# Patient Record
Sex: Female | Born: 1995
Health system: Southern US, Community
[De-identification: ages and names within clinical notes are randomized; demographics above are authoritative.]

## PROBLEM LIST (undated history)

## (undated) ENCOUNTER — Ambulatory Visit (HOSPITAL_COMMUNITY): Admission: EM | Payer: Self-pay | Source: Home / Self Care

## (undated) ENCOUNTER — Ambulatory Visit (HOSPITAL_COMMUNITY): Payer: Medicaid Other

## (undated) DIAGNOSIS — D649 Anemia, unspecified: Secondary | ICD-10-CM

## (undated) HISTORY — PX: INDUCED ABORTION: SHX677

---

## 2017-11-21 ENCOUNTER — Encounter (HOSPITAL_COMMUNITY): Payer: Self-pay | Admitting: Emergency Medicine

## 2017-11-21 ENCOUNTER — Emergency Department (HOSPITAL_COMMUNITY)
Admission: EM | Admit: 2017-11-21 | Discharge: 2017-11-21 | Discharge status: Against Medical Advice | Payer: Self-pay | Attending: Emergency Medicine | Admitting: Emergency Medicine

## 2017-11-21 DIAGNOSIS — Z5321 Procedure and treatment not carried out due to patient leaving prior to being seen by health care provider: Secondary | ICD-10-CM | POA: Insufficient documentation

## 2017-11-21 HISTORY — DX: Anemia, unspecified: D64.9

## 2017-11-21 LAB — CBC
HCT: 40.9 % (ref 36.0–46.0)
HEMOGLOBIN: 13.8 g/dL (ref 12.0–15.0)
MCH: 33.7 pg (ref 26.0–34.0)
MCHC: 33.7 g/dL (ref 30.0–36.0)
MCV: 99.8 fL (ref 78.0–100.0)
PLATELETS: 240 10*3/uL (ref 150–400)
RBC: 4.1 MIL/uL (ref 3.87–5.11)
RDW: 11.8 % (ref 11.5–15.5)
WBC: 9.5 10*3/uL (ref 4.0–10.5)

## 2017-11-21 LAB — COMPREHENSIVE METABOLIC PANEL
ALK PHOS: 61 U/L (ref 38–126)
ALT: 12 U/L — ABNORMAL LOW (ref 14–54)
ANION GAP: 6 (ref 5–15)
AST: 24 U/L (ref 15–41)
Albumin: 4.4 g/dL (ref 3.5–5.0)
BILIRUBIN TOTAL: 0.5 mg/dL (ref 0.3–1.2)
BUN: 11 mg/dL (ref 6–20)
CALCIUM: 9.2 mg/dL (ref 8.9–10.3)
CO2: 25 mmol/L (ref 22–32)
CREATININE: 0.63 mg/dL (ref 0.44–1.00)
Chloride: 109 mmol/L (ref 101–111)
Glucose, Bld: 103 mg/dL — ABNORMAL HIGH (ref 65–99)
Potassium: 3.7 mmol/L (ref 3.5–5.1)
SODIUM: 140 mmol/L (ref 135–145)
TOTAL PROTEIN: 7.1 g/dL (ref 6.5–8.1)

## 2017-11-21 LAB — I-STAT BETA HCG BLOOD, ED (MC, WL, AP ONLY)

## 2017-11-21 LAB — LIPASE, BLOOD: Lipase: 24 U/L (ref 11–51)

## 2017-11-21 MED ORDER — ONDANSETRON 4 MG PO TBDP
4.0000 mg | ORAL_TABLET | Freq: Once | ORAL | Status: AC | PRN
  Administered 2017-11-21: 4 mg via ORAL
  Filled 2017-11-21: qty 1

## 2017-11-21 NOTE — ED Notes (Signed)
Pt called for room placement no response.

## 2017-11-21 NOTE — ED Notes (Signed)
No answer from lobby  

## 2017-11-21 NOTE — ED Triage Notes (Signed)
Patient c/o abdominal pain that has been intermittent for past few years. Describes it as a wave across the bottom of her stomach. Pt states she has never been diagnosed with anything. One episode of emesis this am which is new symptom.

## 2017-11-21 NOTE — ED Notes (Signed)
Patient called with no response.

## 2017-11-21 NOTE — ED Notes (Signed)
Pt had drawn for labs:  Gold Lavender Blue Lt green Dark green x2 

## 2018-06-01 ENCOUNTER — Encounter (HOSPITAL_COMMUNITY): Payer: Self-pay | Admitting: Emergency Medicine

## 2018-06-01 ENCOUNTER — Emergency Department (HOSPITAL_COMMUNITY)
Admission: EM | Admit: 2018-06-01 | Discharge: 2018-06-02 | Discharge status: Against Medical Advice | Payer: Self-pay | Attending: Emergency Medicine | Admitting: Emergency Medicine

## 2018-06-01 ENCOUNTER — Other Ambulatory Visit: Payer: Self-pay

## 2018-06-01 ENCOUNTER — Emergency Department (HOSPITAL_COMMUNITY): Payer: Self-pay

## 2018-06-01 DIAGNOSIS — O208 Other hemorrhage in early pregnancy: Secondary | ICD-10-CM | POA: Insufficient documentation

## 2018-06-01 DIAGNOSIS — O4690 Antepartum hemorrhage, unspecified, unspecified trimester: Secondary | ICD-10-CM

## 2018-06-01 DIAGNOSIS — Z3A01 Less than 8 weeks gestation of pregnancy: Secondary | ICD-10-CM | POA: Insufficient documentation

## 2018-06-01 LAB — I-STAT BETA HCG BLOOD, ED (MC, WL, AP ONLY): I-stat hCG, quantitative: >2000 m[IU]/mL — ABNORMAL HIGH (ref <5)

## 2018-06-01 NOTE — ED Triage Notes (Signed)
Pt states she is [redacted] weeks pregnant with twins and has had spotting x 1 week.  Reports heavy dark red bleeding today without clots.  Also reports increased vomiting today. Reports [place] last week.

## 2018-06-02 LAB — CBC WITH DIFFERENTIAL/PLATELET
Abs Immature Granulocytes: 0.1 10*3/uL (ref 0.0–0.1)
BASOS ABS: 0 10*3/uL (ref 0.0–0.1)
Basophils Relative: 0 %
EOS PCT: 0 %
Eosinophils Absolute: 0 10*3/uL (ref 0.0–0.7)
HCT: 35.5 % — ABNORMAL LOW (ref 36.0–46.0)
Hemoglobin: 11.6 g/dL — ABNORMAL LOW (ref 12.0–15.0)
IMMATURE GRANULOCYTES: 1 %
LYMPHS PCT: 7 %
Lymphs Abs: 1.3 10*3/uL (ref 0.7–4.0)
MCH: 32.9 pg (ref 26.0–34.0)
MCHC: 32.7 g/dL (ref 30.0–36.0)
MCV: 100.6 fL — ABNORMAL HIGH (ref 78.0–100.0)
Monocytes Absolute: 0.5 10*3/uL (ref 0.1–1.0)
Monocytes Relative: 3 %
NEUTROS PCT: 89 %
Neutro Abs: 16.1 10*3/uL — ABNORMAL HIGH (ref 1.7–7.7)
Platelets: 244 10*3/uL (ref 150–400)
RBC: 3.53 MIL/uL — AB (ref 3.87–5.11)
RDW: 10.9 % — AB (ref 11.5–15.5)
WBC: 18 10*3/uL — AB (ref 4.0–10.5)

## 2018-06-02 LAB — BASIC METABOLIC PANEL
Anion gap: 5 (ref 5–15)
BUN: <5 mg/dL — ABNORMAL LOW (ref 6–20)
CALCIUM: 9.1 mg/dL (ref 8.9–10.3)
CHLORIDE: 106 mmol/L (ref 101–111)
CO2: 25 mmol/L (ref 22–32)
Creatinine, Ser: 0.63 mg/dL (ref 0.44–1.00)
GFR calc non Af Amer: >60 mL/min (ref >60)
Glucose, Bld: 112 mg/dL — ABNORMAL HIGH (ref 65–99)
Potassium: 3.8 mmol/L (ref 3.5–5.1)
SODIUM: 136 mmol/L (ref 135–145)

## 2018-06-02 LAB — ABO/RH: ABO/RH(D): B POS

## 2018-06-02 LAB — HCG, QUANTITATIVE, PREGNANCY: HCG, BETA CHAIN, QUANT, S: 85104 m[IU]/mL — AB (ref <5)

## 2018-06-02 NOTE — ED Notes (Addendum)
Pt was brought back to triage for reassessment for increased bleeding.  Dr. Venora Maples notified.

## 2018-06-02 NOTE — ED Notes (Signed)
Pt left family member was upset said Pt was bleeding heavy. Wanted to be seen sooner left to go to another hospital.

## 2018-06-02 NOTE — ED Provider Notes (Signed)
Patient placed in Quick Look pathway, seen and evaluated   Chief Complaint: Vaginal bleeding/pregnant  HPI:   Patient is a 22 year old female who presents the emergency department with complaints of vaginal bleeding.  She states that she is currently pregnant with twins based on an ultrasound performed to the clinic in [place].  She is a G3 P1 A1.  No lightheadedness or near syncope.  No other complaints  ROS: No lightheadedness.  (one)  Physical Exam:   Gen: No distress  Neuro: Awake and Alert  Skin: Warm    Focused Exam: Pelvic exam deferred given evaluation in triage.  She will need pelvic performed when she is placed in a room   Initiation of care has begun. The patient has been counseled on the process, plan, and necessity for staying for the completion/evaluation, and the remainder of the medical screening examination   I have ordered ultrasound as well as laboratory studies.  Her vital signs are stable at this time.   Jola Schmidt, MD 06/02/18 [phone]

## 2018-06-02 NOTE — ED Notes (Signed)
Dr. Venora Maples in triage room to assess pt.  Tyrone, lab tech drawing blood.  Visitor later came out to desk wanting to speak to EDP in private.  Does not want EDP to talk with patient in front of other medical personnel in room.

## 2018-06-02 NOTE — ED Notes (Addendum)
"  Fiance" out to triage desk and verbally aggressive with RN- states, "She is bleeding heavy and if she has a miscarriage I am suing the hospital." Attempted to explain that I would be in as soon as I finished with another pt.  He went back in triage room and shut door.  Then opened door, leaning against sink, on phone, staring at RN.  Charge RN made aware of need for room and visitors actions and being verbally aggressive.

## 2018-09-19 ENCOUNTER — Encounter (HOSPITAL_COMMUNITY): Payer: Self-pay | Admitting: Emergency Medicine

## 2018-09-19 ENCOUNTER — Other Ambulatory Visit: Payer: Self-pay

## 2018-09-19 ENCOUNTER — Emergency Department (HOSPITAL_COMMUNITY)
Admission: EM | Admit: 2018-09-19 | Discharge: 2018-09-19 | Disposition: A | Discharge status: Home / Self Care | Payer: Self-pay | Attending: Emergency Medicine | Admitting: Emergency Medicine

## 2018-09-19 ENCOUNTER — Encounter (HOSPITAL_COMMUNITY): Payer: Self-pay

## 2018-09-19 DIAGNOSIS — N939 Abnormal uterine and vaginal bleeding, unspecified: Secondary | ICD-10-CM | POA: Insufficient documentation

## 2018-09-19 DIAGNOSIS — Z5321 Procedure and treatment not carried out due to patient leaving prior to being seen by health care provider: Secondary | ICD-10-CM | POA: Insufficient documentation

## 2018-09-19 DIAGNOSIS — F17210 Nicotine dependence, cigarettes, uncomplicated: Secondary | ICD-10-CM | POA: Insufficient documentation

## 2018-09-19 LAB — URINALYSIS, ROUTINE W REFLEX MICROSCOPIC
RBC / HPF: >50 RBC/hpf — ABNORMAL HIGH (ref 0–5)
WBC, UA: >50 WBC/hpf — ABNORMAL HIGH (ref 0–5)

## 2018-09-19 LAB — BASIC METABOLIC PANEL WITH GFR
Anion gap: 4 — ABNORMAL LOW (ref 5–15)
BUN: 10 mg/dL (ref 6–20)
CO2: 23 mmol/L (ref 22–32)
Calcium: 8.8 mg/dL — ABNORMAL LOW (ref 8.9–10.3)
Chloride: 111 mmol/L (ref 98–111)
Creatinine, Ser: 0.71 mg/dL (ref 0.44–1.00)
GFR calc Af Amer: >60 mL/min
GFR calc non Af Amer: >60 mL/min
Glucose, Bld: 127 mg/dL — ABNORMAL HIGH (ref 70–99)
Potassium: 4.4 mmol/L (ref 3.5–5.1)
Sodium: 138 mmol/L (ref 135–145)

## 2018-09-19 LAB — CBC
HCT: 33.9 % — ABNORMAL LOW (ref 36.0–46.0)
Hemoglobin: 9.7 g/dL — ABNORMAL LOW (ref 12.0–15.0)
MCH: 24.5 pg — ABNORMAL LOW (ref 26.0–34.0)
MCHC: 28.6 g/dL — ABNORMAL LOW (ref 30.0–36.0)
MCV: 85.6 fL (ref 80.0–100.0)
Platelets: 304 K/uL (ref 150–400)
RBC: 3.96 MIL/uL (ref 3.87–5.11)
RDW: 15.9 % — ABNORMAL HIGH (ref 11.5–15.5)
WBC: 5.3 K/uL (ref 4.0–10.5)
nRBC: 0 % (ref 0.0–0.2)

## 2018-09-19 LAB — I-STAT BETA HCG BLOOD, ED (MC, WL, AP ONLY)

## 2018-09-19 NOTE — ED Notes (Signed)
Pt is screaming and saying she had bled all over the chairs after the tech had tried to offer her pads and underwear and pt was refusing everything that was offered to her. Pt's family then came up to desk and started cussing at nurse and tech saying we did not offer her anything what the hell does he need to do scream and holler and show his ass.I told the family member I would call back and see what I needed to do. He was not listening to that, he kept getting louder and louder.  Pt then got up screaming saying clean my vagina blood up off the floor bitches. Then the pt's family member came back in screaming bitches and what are our name. He called [place] all dirty bitches and lick his ass. Jefm Bryant was on the phone with registration and heard all of this. He called the registration a stupid bitch for not giving her his name. Glade Nurse, and [place] registration was witness to all of this and supervisor was on phone with [place].

## 2018-09-19 NOTE — ED Triage Notes (Signed)
Pt complains of severe vaginal bleeding that started tonight, she states that she just got off her normal period and this is different

## 2018-09-19 NOTE — ED Notes (Signed)
This RN called to lobby by staff. Upon entering waiting area pt is yelling and cursing at staff, pt SO is recording staff members with his cell phone. Pt is yelling stating that no one is "doing a damn thing for her" and she is going to another hospital. Pt continues to yell in the lobby stating "I hope yall have fun cleaning up my vaginal blood, I'm going to sue this fucking hospital!" After speaking with staff members Levada Dy RN, Bre EMT) pt was offered pads, underwear, pants on multiple occasions and pt had refused, she verbalized she was mad about waiting and wanted to sit in the chair and bleed instead of changing her pad as retaliation.

## 2018-09-19 NOTE — ED Triage Notes (Signed)
Pt reports getting her period in the middle of September and now started bleeding a few days ago again but the bleeding has since become heavier. Pt reports today when she went to the bathroom there was blood everywhere with clots. Hx of anemia and states she is starting to feel weak and see stars. Reports pelvic cramping. Ambulatory in triage but feels some dizziness with position changes.

## 2018-09-19 NOTE — ED Notes (Addendum)
Pt visitor came and notified staff that the patient is having "a lot of bleeding from her vagina". Staff went to get the pt a menstrual  pad and disposable underwear, the pt angrily refused them.

## 2018-09-20 ENCOUNTER — Other Ambulatory Visit: Payer: Self-pay

## 2018-09-20 ENCOUNTER — Emergency Department (HOSPITAL_COMMUNITY)
Admission: EM | Admit: 2018-09-20 | Discharge: 2018-09-20 | Disposition: A | Discharge status: Home / Self Care | Payer: Self-pay | Attending: Emergency Medicine | Admitting: Emergency Medicine

## 2018-09-20 DIAGNOSIS — N939 Abnormal uterine and vaginal bleeding, unspecified: Secondary | ICD-10-CM

## 2018-09-20 LAB — BASIC METABOLIC PANEL
ANION GAP: 7 (ref 5–15)
BUN: 12 mg/dL (ref 6–20)
CALCIUM: 8.7 mg/dL — AB (ref 8.9–10.3)
CO2: 23 mmol/L (ref 22–32)
Chloride: 111 mmol/L (ref 98–111)
Creatinine, Ser: 0.7 mg/dL (ref 0.44–1.00)
Glucose, Bld: 104 mg/dL — ABNORMAL HIGH (ref 70–99)
POTASSIUM: 3.9 mmol/L (ref 3.5–5.1)
SODIUM: 141 mmol/L (ref 135–145)

## 2018-09-20 LAB — CBC
HCT: 31.1 % — ABNORMAL LOW (ref 36.0–46.0)
HEMOGLOBIN: 9.2 g/dL — AB (ref 12.0–15.0)
MCH: 25.1 pg — ABNORMAL LOW (ref 26.0–34.0)
MCHC: 29.6 g/dL — ABNORMAL LOW (ref 30.0–36.0)
MCV: 85 fL (ref 80.0–100.0)
NRBC: 0 % (ref 0.0–0.2)
PLATELETS: 266 10*3/uL (ref 150–400)
RBC: 3.66 MIL/uL — AB (ref 3.87–5.11)
RDW: 15.9 % — ABNORMAL HIGH (ref 11.5–15.5)
WBC: 6.8 10*3/uL (ref 4.0–10.5)

## 2018-09-20 LAB — I-STAT BETA HCG BLOOD, ED (MC, WL, AP ONLY): I-stat hCG, quantitative: <5 m[IU]/mL (ref <5)

## 2018-09-20 MED ORDER — MEGESTROL ACETATE 40 MG PO TABS: 120.0000 mg | ORAL_TABLET | Freq: Every day | ORAL | 0 refills | Status: AC

## 2018-09-20 MED ORDER — MEGESTROL ACETATE 40 MG PO TABS
120.0000 mg | ORAL_TABLET | ORAL | Status: AC
  Administered 2018-09-20: 120 mg via ORAL
  Filled 2018-09-20: qty 3

## 2018-09-20 MED ORDER — PROMETHAZINE HCL 25 MG/ML IJ SOLN
25.0000 mg | INTRAMUSCULAR | Status: DC
  Filled 2018-09-20: qty 1

## 2018-09-20 MED ORDER — PROMETHAZINE HCL 25 MG/ML IJ SOLN
25.0000 mg | Freq: Once | INTRAMUSCULAR | Status: AC
  Administered 2018-09-20: 25 mg via INTRAMUSCULAR
  Filled 2018-09-20: qty 1

## 2018-09-20 MED ORDER — ACETAMINOPHEN 500 MG PO TABS
1000.0000 mg | ORAL_TABLET | Freq: Once | ORAL | Status: AC
  Administered 2018-09-20: 1000 mg via ORAL
  Filled 2018-09-20: qty 2

## 2018-09-20 NOTE — ED Provider Notes (Signed)
[facility] DEPT Provider Note   CSN: [id number] Arrival date & time: 09/19/18  2349     History   Chief Complaint Chief Complaint  Patient presents with  . Vaginal Bleeding    HPI Roberta Alexander is a 22 y.o. female.  HPI  Roberta Alexander is a 22 year old female with a history of anemia who presents to the emergency department for evaluation of excessive vaginal bleeding.  Patient reports that she had her normal menstrual cycle sometime in the middle of September.  She thought it was odd because she started her menstrual cycle again 10/3.  Bleeding was initially light, but became extremely heavy yesterday passing many clots and soaking through pads every hour.  She denies history of heavy menstrual bleeding.  She also reports that starting yesterday she developed suprapubic abdominal cramping which is about a 6/10 in severity. She states that pain feels like her typical menstrual cramps.  Has not taken any over-the-counter medications for her symptoms.  She reports that she is sexually active with a female and female partner.  She denies regular condom use and does not take birth control.  She denies fevers, chills, nausea/vomiting, dysuria, urinary frequency, flank pain, shortness of breath, weakness, syncope, lightheadedness.  She does say that she felt lightheaded earlier today when she was on the toilet, but this is since resolved.  No prior abdominal surgeries.  She had a d&c 05/2018.   Past Medical History:  Diagnosis Date  . Anemia     There are no active problems to display for this patient.   Past Surgical History:  Procedure Laterality Date  . INDUCED ABORTION       OB History    Gravida  1   Para      Term      Preterm      AB      Living        SAB      TAB      Ectopic      Multiple      Live Births               Home Medications    Prior to Admission medications   Not on File    Family History No family  history on file.  Social History Social History   Tobacco Use  . Smoking status: Current Every Day Smoker    Packs/day: 1.00    Types: Cigarettes  . Smokeless tobacco: Never Used  Substance Use Topics  . Alcohol use: Yes    Comment: occ  . Drug use: Not Currently     Allergies   Patient has no known allergies.   Review of Systems Review of Systems  Constitutional: Negative for chills and fever.  Respiratory: Negative for shortness of breath.   Cardiovascular: Negative for chest pain and palpitations.  Gastrointestinal: Positive for abdominal pain (suprapubic cramping). Negative for diarrhea, nausea and vomiting.  Genitourinary: Positive for vaginal bleeding. Negative for difficulty urinating and dysuria.  Musculoskeletal: Negative for gait problem.  Skin: Negative for color change.  Neurological: Positive for light-headedness. Negative for syncope and weakness.  Psychiatric/Behavioral: Negative for agitation.  All other systems reviewed and are negative.    Physical Exam Updated Vital Signs BP (!) 117/92   Pulse 94   Temp 98.2 F (36.8 C) (Oral)   Resp 18   LMP 09/19/2018   SpO2 100%   Physical Exam  Constitutional: She appears well-developed and well-nourished. No  distress.  NAD, non-toxic appearing.   HENT:  Head: Normocephalic and atraumatic.  Mouth/Throat: Oropharynx is clear and moist.  Eyes: Pupils are equal, round, and reactive to light. Conjunctivae are normal. Right eye exhibits no discharge. Left eye exhibits no discharge.  No conjunctival pallor.  Neck: Normal range of motion.  Cardiovascular: Normal rate and regular rhythm.  Pulmonary/Chest: Effort normal and breath sounds normal. No stridor. No respiratory distress. She has no wheezes. She has no rales.  Abdominal:  Abdomen soft and nondistended.  Bowel sounds normoactive.  Mildly tender to palpation in the suprapubic area.  No guarding, rigidity or rebound tenderness.  Genitourinary:    Genitourinary Comments: Chaperone present for exam.  Significant vaginal bleeding with many clots.    Neurological: She is alert. Coordination normal.  Skin: Skin is warm and dry. She is not diaphoretic.  Psychiatric: She has a normal mood and affect. Her behavior is normal.  Nursing note and vitals reviewed.   ED Treatments / Results  Labs (all labs ordered are listed, but only abnormal results are displayed) Labs Reviewed  CBC - Abnormal; Notable for the following components:      Result Value   RBC 3.66 (*)    Hemoglobin 9.2 (*)    HCT 31.1 (*)    MCH 25.1 (*)    MCHC 29.6 (*)    RDW 15.9 (*)    All other components within normal limits  BASIC METABOLIC PANEL - Abnormal; Notable for the following components:   Glucose, Bld 104 (*)    Calcium 8.7 (*)    All other components within normal limits  I-STAT BETA HCG BLOOD, ED ([facility], [facility], [facility] ONLY)  ABO/RH    EKG None  Radiology No results found.  Procedures Procedures (including critical care time)  Medications Ordered in ED Medications  acetaminophen (TYLENOL) tablet 1,000 mg (1,000 mg Oral Given 09/20/18 0040)  megestrol (MEGACE) tablet 120 mg (120 mg Oral Given 09/20/18 0241)  promethazine (PHENERGAN) injection 25 mg (25 mg Intramuscular Given 09/20/18 0242)     Initial Impression / Assessment and Plan / ED Course  I have reviewed the triage vital signs and the nursing notes.  Pertinent labs & imaging results that were available during my care of the patient were reviewed by me and considered in my medical decision making (see chart for details).     Presents with heavy vaginal bleeding.  She is hemodynamically stable.  Pelvic exam with heavy vaginal bleeding, passing several clots.  Beta hCG negative.  No concern for ectopic pregnancy or miscarriage.  She is anemic with hemoglobin 9.2, has a history of this although this is worse than her baseline. Differential includes fibroids vs adenomyosis vs uterine hyperplasia  vs ovulatory dysfunction.  I discussed this patient with gynecologist Dr. Elonda Husky who recommends 25mg  phenergan in the ED and 120mg  Megace daily with follow up by gynecology in a week. I counseled patient on this plan and she agrees to follow up with [facility] outpatient clinic.  I have counseled her on strict return precautions and she agrees and appears reliable.  Final Clinical Impressions(s) / ED Diagnoses   Final diagnoses:  Vaginal bleeding    ED Discharge Orders         Ordered    megestrol (MEGACE) 40 MG tablet  Daily     09/20/18 0315           Glyn Ade, PA-C 09/20/18 0323    Cardama, Grayce Sessions,  MD 09/20/18 0131

## 2018-09-20 NOTE — Discharge Instructions (Addendum)
Please follow-up with [facility] outpatient clinic, I have listed the information below.  Make an appointment for follow-up in 1 week.  Take Megace 120 mg daily until you are seen by the gynecologist.  Come back to the ER if you have any new or concerning symptoms like fever, trouble breathing, feeling like you are going to pass out.

## 2019-03-18 ENCOUNTER — Other Ambulatory Visit: Payer: Self-pay

## 2019-03-18 ENCOUNTER — Emergency Department (HOSPITAL_COMMUNITY)
Admission: EM | Admit: 2019-03-18 | Discharge: 2019-03-18 | Disposition: A | Discharge status: Home / Self Care | Payer: Self-pay | Attending: Emergency Medicine | Admitting: Emergency Medicine

## 2019-03-18 ENCOUNTER — Emergency Department (HOSPITAL_COMMUNITY): Payer: Self-pay

## 2019-03-18 ENCOUNTER — Encounter (HOSPITAL_COMMUNITY): Payer: Self-pay

## 2019-03-18 DIAGNOSIS — R102 Pelvic and perineal pain: Secondary | ICD-10-CM | POA: Insufficient documentation

## 2019-03-18 DIAGNOSIS — Z87891 Personal history of nicotine dependence: Secondary | ICD-10-CM | POA: Insufficient documentation

## 2019-03-18 DIAGNOSIS — Z3A01 Less than 8 weeks gestation of pregnancy: Secondary | ICD-10-CM | POA: Insufficient documentation

## 2019-03-18 DIAGNOSIS — O209 Hemorrhage in early pregnancy, unspecified: Secondary | ICD-10-CM | POA: Insufficient documentation

## 2019-03-18 DIAGNOSIS — D649 Anemia, unspecified: Secondary | ICD-10-CM | POA: Insufficient documentation

## 2019-03-18 DIAGNOSIS — O418X10 Other specified disorders of amniotic fluid and membranes, first trimester, not applicable or unspecified: Secondary | ICD-10-CM | POA: Insufficient documentation

## 2019-03-18 DIAGNOSIS — O210 Mild hyperemesis gravidarum: Secondary | ICD-10-CM | POA: Insufficient documentation

## 2019-03-18 DIAGNOSIS — R8271 Bacteriuria: Secondary | ICD-10-CM | POA: Insufficient documentation

## 2019-03-18 DIAGNOSIS — O468X1 Other antepartum hemorrhage, first trimester: Secondary | ICD-10-CM | POA: Insufficient documentation

## 2019-03-18 LAB — URINALYSIS, ROUTINE W REFLEX MICROSCOPIC
Bilirubin Urine: NEGATIVE
Glucose, UA: NEGATIVE mg/dL
Hgb urine dipstick: NEGATIVE
Ketones, ur: NEGATIVE mg/dL
Nitrite: NEGATIVE
Protein, ur: NEGATIVE mg/dL
Specific Gravity, Urine: 1.018 (ref 1.005–1.030)
pH: 7 (ref 5.0–8.0)

## 2019-03-18 LAB — COMPREHENSIVE METABOLIC PANEL
ALT: 12 U/L (ref 0–44)
AST: 19 U/L (ref 15–41)
Albumin: 4 g/dL (ref 3.5–5.0)
Alkaline Phosphatase: 46 U/L (ref 38–126)
Anion gap: 6 (ref 5–15)
BUN: 9 mg/dL (ref 6–20)
CO2: 23 mmol/L (ref 22–32)
Calcium: 8.7 mg/dL — ABNORMAL LOW (ref 8.9–10.3)
Chloride: 108 mmol/L (ref 98–111)
Creatinine, Ser: 0.62 mg/dL (ref 0.44–1.00)
GFR calc Af Amer: >60 mL/min (ref >60)
GFR calc non Af Amer: >60 mL/min (ref >60)
Glucose, Bld: 83 mg/dL (ref 70–99)
Potassium: 3.4 mmol/L — ABNORMAL LOW (ref 3.5–5.1)
Sodium: 137 mmol/L (ref 135–145)
Total Bilirubin: 0.4 mg/dL (ref 0.3–1.2)
Total Protein: 7 g/dL (ref 6.5–8.1)

## 2019-03-18 LAB — CBC WITH DIFFERENTIAL/PLATELET
Abs Immature Granulocytes: 0.03 10*3/uL (ref 0.00–0.07)
Basophils Absolute: 0 10*3/uL (ref 0.0–0.1)
Basophils Relative: 0 %
Eosinophils Absolute: 0 10*3/uL (ref 0.0–0.5)
Eosinophils Relative: 1 %
HCT: 28.5 % — ABNORMAL LOW (ref 36.0–46.0)
Hemoglobin: 8.5 g/dL — ABNORMAL LOW (ref 12.0–15.0)
Immature Granulocytes: 0 %
Lymphocytes Relative: 24 %
Lymphs Abs: 1.9 10*3/uL (ref 0.7–4.0)
MCH: 23.7 pg — ABNORMAL LOW (ref 26.0–34.0)
MCHC: 29.8 g/dL — ABNORMAL LOW (ref 30.0–36.0)
MCV: 79.6 fL — ABNORMAL LOW (ref 80.0–100.0)
Monocytes Absolute: 0.5 10*3/uL (ref 0.1–1.0)
Monocytes Relative: 6 %
Neutro Abs: 5.6 10*3/uL (ref 1.7–7.7)
Neutrophils Relative %: 69 %
Platelets: 283 10*3/uL (ref 150–400)
RBC: 3.58 MIL/uL — ABNORMAL LOW (ref 3.87–5.11)
RDW: 22.2 % — ABNORMAL HIGH (ref 11.5–15.5)
WBC: 8 10*3/uL (ref 4.0–10.5)
nRBC: 0 % (ref 0.0–0.2)

## 2019-03-18 LAB — TYPE AND SCREEN
ABO/RH(D): B POS
Antibody Screen: NEGATIVE

## 2019-03-18 LAB — HCG, QUANTITATIVE, PREGNANCY: hCG, Beta Chain, Quant, S: 217901 m[IU]/mL — ABNORMAL HIGH (ref <5)

## 2019-03-18 LAB — WET PREP, GENITAL
Sperm: NONE SEEN
Trich, Wet Prep: NONE SEEN
Yeast Wet Prep HPF POC: NONE SEEN

## 2019-03-18 LAB — I-STAT BETA HCG BLOOD, ED (MC, WL, AP ONLY): I-stat hCG, quantitative: >2000 m[IU]/mL — ABNORMAL HIGH (ref <5)

## 2019-03-18 LAB — ABO/RH: ABO/RH(D): B POS

## 2019-03-18 LAB — PREGNANCY, URINE: Preg Test, Ur: POSITIVE — AB

## 2019-03-18 MED ORDER — NITROFURANTOIN MONOHYD MACRO 100 MG PO CAPS: 100.0000 mg | ORAL_CAPSULE | Freq: Two times a day (BID) | ORAL | 0 refills | Status: DC

## 2019-03-18 MED ORDER — ONDANSETRON HCL 4 MG/2ML IJ SOLN
4.0000 mg | Freq: Once | INTRAMUSCULAR | Status: AC
  Administered 2019-03-18: 4 mg via INTRAVENOUS
  Filled 2019-03-18: qty 2

## 2019-03-18 MED ORDER — SODIUM CHLORIDE 0.9 % IV BOLUS
1000.0000 mL | Freq: Once | INTRAVENOUS | Status: AC
  Administered 2019-03-18: 1000 mL via INTRAVENOUS

## 2019-03-18 NOTE — Discharge Instructions (Addendum)
You were seen in the ED for vaginal bleeding, nausea, vomiting, shortness of breath.   Your hemoglobin (blood count) is only slightly lower than your baseline.  Your bleeding stopped so we did not think you needed any more blood work or blood transfusion. Return if there is return or heavy bleeding, worsening shortness of breath, chest pain, passing out  You had bacteria in your urine, take macrobid for this. Return for fever, flank pain.   Ultrasound showed ** . You need to establish prenatal care /// you need to follow up with outpatient OB for repeat labs in 48-72 hours, call them to confirm this tomorrow.

## 2019-03-18 NOTE — ED Triage Notes (Addendum)
Pt states that she is having vaginal bleeding with clots x 1 week. Pt states that she is pregnant, but unsure of how far along. LMP 01/29/19. Pt states that she is also weak. Pt states she has gone through a pack of pads in a week.

## 2019-03-18 NOTE — ED Provider Notes (Addendum)
[facility] DEPT Provider Note   CSN: [id number] Arrival date & time: 03/18/19  1329    History   Chief Complaint Chief Complaint  Patient presents with  . Vaginal Bleeding    HPI Roberta Alexander is a 23 y.o. currently pregnant female with history of anemia is here for evaluation of vaginal bleeding.  Onset 1 week ago.  Dark blood with clots.  She became concerned because today she started to feel weak and is afraid that she is losing too much blood and her anemia is getting worse.  Reports associated shortness of breath described as feeling out of breath when she walks from her bedroom to her bathroom, headaches.  In the last week she has changed her pad every 1-2 hours but did not have to use a pad today. Bleeding actually stopped today and now only had very small amounts of spotting this morning.  She is supposed to take iron for anemia but has not.  She took a urine pregnancy test and it was positive.  LMP February 12-18.  Since finding out she is pregnant she has had daily nausea and vomiting without any blood.  She last vomited on route to the ED.  Has had associated intermittent suprapubic abdominal "cramping" for the last week.  Has not had OB/GYN prenatal care thus far.  3 pregnancies, 1 live birth, one elective abortion, 1 miscarriage.  She is sexually active with one female partner without condom use.  She denies associated fever, dysuria, changes to BMs, hematemesis, chest pain, cough.  Unsure vaginal discharge due to ongoing vaginal bleeding.  Has never needed blood transfusions.     HPI  Past Medical History:  Diagnosis Date  . Anemia     There are no active problems to display for this patient.   Past Surgical History:  Procedure Laterality Date  . INDUCED ABORTION       OB History    Gravida  2   Para      Term      Preterm      AB      Living        SAB      TAB      Ectopic      Multiple      Live Births                Home Medications    Prior to Admission medications   Medication Sig Start Date End Date Taking? Authorizing Provider  nitrofurantoin, macrocrystal-monohydrate, (MACROBID) 100 MG capsule Take 1 capsule (100 mg total) by mouth 2 (two) times daily. 03/18/19   Kinnie Feil, PA-C    Family History No family history on file.  Social History Social History   Tobacco Use  . Smoking status: Former Smoker    Types: Cigarettes    Last attempt to quit: 12/17/2018    Years since quitting: 0.2  . Smokeless tobacco: Never Used  Substance Use Topics  . Alcohol use: Yes    Comment: occ  . Drug use: Not Currently     Allergies   Patient has no known allergies.   Review of Systems Review of Systems  Respiratory: Positive for shortness of breath.   Genitourinary: Positive for pelvic pain and vaginal bleeding.  Neurological: Positive for weakness (generalized) and headaches.  All other systems reviewed and are negative.    Physical Exam Updated Vital Signs BP 102/76   Pulse 66   Temp 99.2  F (37.3 C) (Oral)   Resp 16   Ht 5\' 5"  (1.651 m)   Wt 47.6 kg   LMP 01/29/2019   SpO2 100%   BMI 17.47 kg/m   Physical Exam Vitals signs and nursing note reviewed.  Constitutional:      Appearance: She is well-developed.     Comments: Non toxic in NAD  HENT:     Head: Normocephalic and atraumatic.     Nose: Nose normal.  Eyes:     Conjunctiva/sclera: Conjunctivae normal.  Neck:     Musculoskeletal: Normal range of motion.  Cardiovascular:     Rate and Rhythm: Normal rate and regular rhythm.  Pulmonary:     Effort: Pulmonary effort is normal.     Breath sounds: Normal breath sounds.  Abdominal:     General: Bowel sounds are normal.     Palpations: Abdomen is soft.     Tenderness: There is abdominal tenderness.     Comments: Suprapubic tenderness. No CVAT. No G/R/R. Negative Murphy's and McBurney's. Active BS to lower quadrants.   Genitourinary:    Vagina: Bleeding  present.     Comments:  Exam performed with RN at bedside for assistance. External genitalia without lesions.  No groin lymphadenopathy.  Vaginal mucosa and cervix pink without lesions.  Scant likely physiologic clear/white discharge in vaginal vault noted.  No bleeding in vaginal vault. No CMT.  Nonpalpable, nontender adnexa.  Perianal skin normal without lesions. Musculoskeletal: Normal range of motion.  Skin:    General: Skin is warm and dry.     Capillary Refill: Capillary refill takes less than 2 seconds.  Neurological:     Mental Status: She is alert.  Psychiatric:        Behavior: Behavior normal.      ED Treatments / Results  Labs (all labs ordered are listed, but only abnormal results are displayed) Labs Reviewed  WET PREP, GENITAL - Abnormal; Notable for the following components:      Result Value   Clue Cells Wet Prep HPF POC PRESENT (*)    WBC, Wet Prep HPF POC MODERATE (*)    All other components within normal limits  URINE CULTURE - Abnormal; Notable for the following components:   Culture   (*)    Value: <10,000 COLONIES/mL INSIGNIFICANT GROWTH Performed at [facility] [address], [place], [place] [address]    All other components within normal limits  CBC WITH DIFFERENTIAL/PLATELET - Abnormal; Notable for the following components:   RBC 3.58 (*)    Hemoglobin 8.5 (*)    HCT 28.5 (*)    MCV 79.6 (*)    MCH 23.7 (*)    MCHC 29.8 (*)    RDW 22.2 (*)    All other components within normal limits  COMPREHENSIVE METABOLIC PANEL - Abnormal; Notable for the following components:   Potassium 3.4 (*)    Calcium 8.7 (*)    All other components within normal limits  HCG, QUANTITATIVE, PREGNANCY - Abnormal; Notable for the following components:   hCG, Beta Chain, Quant, S 217,901 (*)    All other components within normal limits  URINALYSIS, ROUTINE W REFLEX MICROSCOPIC - Abnormal; Notable for the following components:   APPearance CLOUDY (*)     Leukocytes,Ua SMALL (*)    Bacteria, UA MANY (*)    All other components within normal limits  PREGNANCY, URINE - Abnormal; Notable for the following components:   Preg Test, Ur POSITIVE (*)  All other components within normal limits  I-STAT BETA HCG BLOOD, ED ([facility], [facility], [facility] ONLY) - Abnormal; Notable for the following components:   I-stat hCG, quantitative >2,000.0 (*)    All other components within normal limits  ABO/RH  TYPE AND SCREEN  GC/CHLAMYDIA PROBE AMP ([place]) NOT AT [facility]    EKG None  Radiology No results found.  Procedures Procedures (including critical care time)  Medications Ordered in ED Medications  sodium chloride 0.9 % bolus 1,000 mL (0 mLs Intravenous Stopped 03/18/19 1729)  ondansetron (ZOFRAN) injection 4 mg (4 mg Intravenous Given 03/18/19 1644)     Initial Impression / Assessment and Plan / ED Course  I have reviewed the triage vital signs and the nursing notes.  Pertinent labs & imaging results that were available during my care of the patient were reviewed by me and considered in my medical decision making (see chart for details).  Clinical Course as of Mar 21 1849  Mon Mar 18, 2019  1513 I-stat hCG, quantitative(!): >2,000.0 [CG]  1513 Hemoglobin(!): 8.5 [CG]  1513 H/o anemia baseline around 9-9.5.   HCT(!): 28.5 [CG]  1517 Bacteria, UA(!): MANY [CG]  1517 WBC, UA: 11-20 [CG]  1517 Chalmers Guest): SMALL [CG]  7026 ABO/RH(D): B POS [CG]  3785 Spoke with Sam, Midwife at [facility]. Spoke with her to close the loop on this patient. She agrees with office follow up and ABX for bacteriuria.   [SJ]    Clinical Course User Index [CG] Kinnie Feil, PA-C [SJ] Joy, Shawn C, PA-C        ddx includes threatened vs complete miscarriage given h/o of same, vaginal bleeding. Reports exertional dyspnea, HA but no CP in setting of anemia non compliant with iron.  HD stable. Exam reassuring.  Nausea/vomiting likely from first trimester pregnancy. Will  obtain H/H, Rh, T&S, pelvic, [place].   1628: Pelvic exam without any bleeding in vaginal vault, CMT, otherwise reassuring.  She has not had any vaginal bleeding in the ED.  Hemoglobin 8.5, baseline appears to be around 9-9.5.  HD stable.  No recurrence of bleeding.  I do not think there is indication for emergent blood transfusion especially because her bleeding has stopped.  UA suspicious for infection and patient does have suprapubic tenderness, will treat with Macrobid.  Rh+.  I discussed patient with Threasa Beards?  MUA APP, appreciate her recommendations.  If [place] inconclusive/ no IUP or any signs of miscarriage, patient will need recheck of blood work in 70 to 72 hours.  She can do this at the [facility] outpatient clinic.  If normal IUP on ultrasound, patient may be discharged with prenatal care.  Discussed this plan with patient who is comfortable with this.  1630: Pt handed off to oncoming EDPA who will f/u on [place] and discuss findings with patient. Appropriate for discharge unless clinical decline, hypotension, hemorrhage.   Final Clinical Impressions(s) / ED Diagnoses   Final diagnoses:  First trimester bleeding  Bacteriuria  Hyperemesis gravidarum  Chronic anemia  Subchorionic hematoma in first trimester, single or unspecified fetus    ED Discharge Orders         Ordered    nitrofurantoin, macrocrystal-monohydrate, (MACROBID) 100 MG capsule  2 times daily     03/18/19 1635           Kinnie Feil, [place] 03/18/19 1636    Kinnie Feil, PA-C 03/22/19 1851    Jola Schmidt, MD 03/22/19 [phone]

## 2019-03-18 NOTE — ED Provider Notes (Signed)
Roberta Alexander is a 23 y.o. female, with a history of anemia, presenting to the ED with vaginal bleeding for the last week.  HPI from Carmon Sails, PA-C: "Roberta Alexander is a 23 y.o. currently pregnant female with history of anemia is here for evaluation of vaginal bleeding.  Onset 1 week ago.  Dark blood with clots.  She became concerned because today she started to feel weak and is afraid that she is losing too much blood and her anemia is getting worse.  Reports associated shortness of breath described as feeling out of breath when she walks from her bedroom to her bathroom, headaches.  In the last week she has changed her pad every 1-2 hours but did not have to use a pad today. Bleeding actually stopped today and now only had very small amounts of spotting this morning.  She is supposed to take iron for anemia but has not.  She took a urine pregnancy test and it was positive.  LMP February 12-18.  Since finding out she is pregnant she has had daily nausea and vomiting without any blood.  She last vomited on route to the ED.  Has had associated intermittent suprapubic abdominal "cramping" for the last week.  Has not had OB/GYN prenatal care thus far.  3 pregnancies, 1 live birth, one elective abortion, 1 miscarriage.  She is sexually active with one female partner without condom use.  She denies associated fever, dysuria, changes to BMs, hematemesis, chest pain, cough.  Unsure vaginal discharge due to ongoing vaginal bleeding.  Has never needed blood transfusions."  Past Medical History:  Diagnosis Date  . Anemia     Physical Exam  BP 102/76   Pulse 66   Temp 99.2 F (37.3 C) (Oral)   Resp 16   Ht 5\' 5"  (1.651 m)   Wt 47.6 kg   LMP 01/29/2019   SpO2 100%   BMI 17.47 kg/m   Physical Exam Vitals signs and nursing note reviewed.  Constitutional:      General: She is not in acute distress.    Appearance: She is well-developed. She is not diaphoretic.  HENT:     Head: Normocephalic and  atraumatic.     Mouth/Throat:     Mouth: Mucous membranes are moist.     Pharynx: Oropharynx is clear.  Eyes:     Conjunctiva/sclera: Conjunctivae normal.  Neck:     Musculoskeletal: Neck supple.  Cardiovascular:     Rate and Rhythm: Normal rate and regular rhythm.     Pulses: Normal pulses.     Heart sounds: Normal heart sounds.     Comments: Tactile temperature in the extremities appropriate and equal bilaterally. Pulmonary:     Effort: Pulmonary effort is normal. No respiratory distress.     Breath sounds: Normal breath sounds.  Abdominal:     Palpations: Abdomen is soft.     Tenderness: There is no abdominal tenderness. There is no guarding.  Musculoskeletal:     Right lower leg: No edema.     Left lower leg: No edema.  Lymphadenopathy:     Cervical: No cervical adenopathy.  Skin:    General: Skin is warm and dry.  Neurological:     Mental Status: She is alert.  Psychiatric:        Mood and Affect: Mood and affect normal.        Speech: Speech normal.        Behavior: Behavior normal.     ED Course/Procedures  Procedures  Abnormal Labs Reviewed  WET PREP, GENITAL - Abnormal; Notable for the following components:      Result Value   Clue Cells Wet Prep HPF POC PRESENT (*)    WBC, Wet Prep HPF POC MODERATE (*)    All other components within normal limits  CBC WITH DIFFERENTIAL/PLATELET - Abnormal; Notable for the following components:   RBC 3.58 (*)    Hemoglobin 8.5 (*)    HCT 28.5 (*)    MCV 79.6 (*)    MCH 23.7 (*)    MCHC 29.8 (*)    RDW 22.2 (*)    All other components within normal limits  COMPREHENSIVE METABOLIC PANEL - Abnormal; Notable for the following components:   Potassium 3.4 (*)    Calcium 8.7 (*)    All other components within normal limits  HCG, QUANTITATIVE, PREGNANCY - Abnormal; Notable for the following components:   hCG, Beta Chain, Quant, S 217,901 (*)    All other components within normal limits  URINALYSIS, ROUTINE W REFLEX  MICROSCOPIC - Abnormal; Notable for the following components:   APPearance CLOUDY (*)    Leukocytes,Ua SMALL (*)    Bacteria, UA MANY (*)    All other components within normal limits  PREGNANCY, URINE - Abnormal; Notable for the following components:   Preg Test, Ur POSITIVE (*)    All other components within normal limits  I-STAT BETA HCG BLOOD, ED ([facility], [facility], [facility] ONLY) - Abnormal; Notable for the following components:   I-stat hCG, quantitative >2,000.0 (*)    All other components within normal limits   US Ob Comp < 14 Wks  Result Date: 03/18/2019 CLINICAL DATA:  Vaginal bleeding in 1st trimester pregnancy. EXAM: OBSTETRIC <14 WK ULTRASOUND TECHNIQUE: Transabdominal ultrasound was performed for evaluation of the gestation as well as the maternal uterus and adnexal regions. COMPARISON:  None. FINDINGS: Intrauterine gestational sac: Single Yolk sac:  Visualized. Embryo:  Visualized. Cardiac Activity: Visualized. Heart Rate: 157 bpm CRL:   12 mm   7 w 3 d                  [place] EDC: 11/01/2019 Subchorionic hemorrhage:  Moderate subchorionic hemorrhage is seen. Maternal uterus/adnexae: No fibroids identified. Normal appearance of left ovary. Right ovary not directly visualized, however no adnexal mass or abnormal free fluid identified. IMPRESSION: Single living IUP measuring 7 weeks 3 days, with [place] EDC of 11/01/2019. Moderate subchorionic hemorrhage. Electronically Signed   By: Earle Gell M.D.   On: 03/18/2019 16:41    MDM     Clinical Course as of Mar 18 1751  Mon Mar 18, 2019  1513 I-stat hCG, quantitative(!): >2,000.0 [CG]  1513 Hemoglobin(!): 8.5 [CG]  1513 H/o anemia baseline around 9-9.5.   HCT(!): 28.5 [CG]  1517 Bacteria, UA(!): MANY [CG]  1517 WBC, UA: 11-20 [CG]  1517 Chalmers Guest): SMALL [CG]  9937 ABO/RH(D): B POS [CG]  1696 Spoke with Sam, Midwife at [facility]. Spoke with her to close the loop on this patient. She agrees with office follow up and ABX for bacteriuria.   [SJ]     Clinical Course User Index [CG] Kinnie Feil, PA-C [SJ] Layla Maw    Took patient care handoff report from [place], [place]. Plan: Pelvic ultrasound pending.  Review ultrasound and disposition accordingly.  Ultrasound shows living IUP approximately 7 weeks with moderate subchorionic hemorrhage.  This finding was discussed with the patient.  She was pain-free prior to discharge.  We also discussed the finding of bacteriuria and prescribed antibiotic.  She will follow-up with OB/GYN in the office. The patient was given instructions for home care as well as return precautions. Patient voices understanding of these instructions, accepts the plan, and is comfortable with discharge.   Vitals:   03/18/19 1450 03/18/19 1500 03/18/19 1556 03/18/19 1648  BP: 108/68 107/63 107/63 102/76  Pulse: 76 74 74 66  Resp: 18  18 16   Temp:      TempSrc:      SpO2: 100% 100% 100% 100%  Weight:      Height:           Lorayne Bender, PA-C 03/18/19 1754    Lacretia Leigh, MD 03/19/19 2249

## 2019-03-19 LAB — URINE CULTURE: Culture: <10000 — AB

## 2019-03-19 LAB — GC/CHLAMYDIA PROBE AMP (~~LOC~~) NOT AT ARMC
Chlamydia: NEGATIVE
Neisseria Gonorrhea: NEGATIVE

## 2019-11-06 ENCOUNTER — Other Ambulatory Visit: Payer: Self-pay

## 2019-11-06 ENCOUNTER — Ambulatory Visit (INDEPENDENT_AMBULATORY_CARE_PROVIDER_SITE_OTHER): Payer: Medicaid Other

## 2019-11-06 DIAGNOSIS — Z3201 Encounter for pregnancy test, result positive: Secondary | ICD-10-CM

## 2019-11-06 DIAGNOSIS — Z3480 Encounter for supervision of other normal pregnancy, unspecified trimester: Secondary | ICD-10-CM

## 2019-11-06 DIAGNOSIS — Z3200 Encounter for pregnancy test, result unknown: Secondary | ICD-10-CM

## 2019-11-06 HISTORY — DX: Encounter for supervision of other normal pregnancy, unspecified trimester: Z34.80

## 2019-11-06 LAB — POCT URINE PREGNANCY: Preg Test, Ur: POSITIVE — AB

## 2019-11-06 MED ORDER — BLOOD PRESSURE KIT DEVI: 1.0000 | 0 refills | Status: DC | PRN

## 2019-11-06 NOTE — Progress Notes (Signed)
..   [name] presents today for UPT. She has no unusual complaints. LMP: 07-15-19    OBJECTIVE: Appears well, in no apparent distress.  OB History    Gravida  3   Para  1   Term  1   Preterm      AB  1   Living  1     SAB      TAB      Ectopic      Multiple      Live Births  1          Home UPT Result:Positive In-Office UPT result:Positive I have reviewed the patient's medical, obstetrical, social, and family histories, and medications.   ASSESSMENT: Positive pregnancy test Completed NOB intake, BP cuff sent today. Pt wanted to wait until NOB appt to complete labs.  PLAN Prenatal care to be completed at: [facility]

## 2019-11-06 NOTE — Progress Notes (Signed)
Patient seen and assessed by nursing staff during this encounter. I have reviewed the chart and agree with the documentation and plan.  Mora Bellman, MD 11/06/2019 1:42 PM

## 2019-11-12 ENCOUNTER — Other Ambulatory Visit (HOSPITAL_COMMUNITY)
Admission: RE | Admit: 2019-11-12 | Discharge: 2019-11-12 | Disposition: A | Discharge status: Home / Self Care | Payer: Medicaid Other | Source: Ambulatory Visit | Attending: Obstetrics & Gynecology | Admitting: Obstetrics & Gynecology

## 2019-11-12 ENCOUNTER — Ambulatory Visit (INDEPENDENT_AMBULATORY_CARE_PROVIDER_SITE_OTHER): Payer: Medicaid Other | Admitting: Obstetrics & Gynecology

## 2019-11-12 ENCOUNTER — Other Ambulatory Visit: Payer: Self-pay

## 2019-11-12 ENCOUNTER — Encounter: Payer: Self-pay | Admitting: Obstetrics & Gynecology

## 2019-11-12 DIAGNOSIS — Z3480 Encounter for supervision of other normal pregnancy, unspecified trimester: Secondary | ICD-10-CM | POA: Diagnosis not present

## 2019-11-12 DIAGNOSIS — Z3482 Encounter for supervision of other normal pregnancy, second trimester: Secondary | ICD-10-CM

## 2019-11-12 DIAGNOSIS — R636 Underweight: Secondary | ICD-10-CM | POA: Insufficient documentation

## 2019-11-12 DIAGNOSIS — Z3481 Encounter for supervision of other normal pregnancy, first trimester: Secondary | ICD-10-CM | POA: Diagnosis not present

## 2019-11-12 DIAGNOSIS — Z3A20 20 weeks gestation of pregnancy: Secondary | ICD-10-CM

## 2019-11-12 NOTE — Patient Instructions (Signed)

## 2019-11-12 NOTE — Progress Notes (Signed)
Patient is in the office for NOB, states not feeling fetal movement yet, denies pain.

## 2019-11-14 LAB — CERVICOVAGINAL ANCILLARY ONLY
Bacterial Vaginitis (gardnerella): POSITIVE — AB
Candida Glabrata: NEGATIVE
Candida Vaginitis: NEGATIVE
Chlamydia: NEGATIVE
Comment: NEGATIVE
Comment: NEGATIVE
Comment: NEGATIVE
Comment: NEGATIVE
Comment: NEGATIVE
Comment: NORMAL
Neisseria Gonorrhea: NEGATIVE
Trichomonas: NEGATIVE

## 2019-11-14 LAB — OBSTETRIC PANEL, INCLUDING HIV
Antibody Screen: NEGATIVE
Basophils Absolute: 0 10*3/uL (ref 0.0–0.2)
Basos: 0 %
EOS (ABSOLUTE): 0.2 10*3/uL (ref 0.0–0.4)
Eos: 2 %
HIV Screen 4th Generation wRfx: NONREACTIVE
Hematocrit: 30.6 % — ABNORMAL LOW (ref 34.0–46.6)
Hemoglobin: 9.4 g/dL — ABNORMAL LOW (ref 11.1–15.9)
Hepatitis B Surface Ag: NEGATIVE
Immature Grans (Abs): 0 10*3/uL (ref 0.0–0.1)
Immature Granulocytes: 0 %
Lymphocytes Absolute: 2.4 10*3/uL (ref 0.7–3.1)
Lymphs: 20 %
MCH: 25.3 pg — ABNORMAL LOW (ref 26.6–33.0)
MCHC: 30.7 g/dL — ABNORMAL LOW (ref 31.5–35.7)
MCV: 83 fL (ref 79–97)
Monocytes Absolute: 0.6 10*3/uL (ref 0.1–0.9)
Monocytes: 6 %
Neutrophils Absolute: 8.3 10*3/uL — ABNORMAL HIGH (ref 1.4–7.0)
Neutrophils: 72 %
Platelets: 412 10*3/uL (ref 150–450)
RBC: 3.71 x10E6/uL — ABNORMAL LOW (ref 3.77–5.28)
RDW: 17.8 % — ABNORMAL HIGH (ref 11.7–15.4)
RPR Ser Ql: NONREACTIVE
Rh Factor: POSITIVE
Rubella Antibodies, IGG: 2.69 index (ref >0.99)
WBC: 11.6 10*3/uL — ABNORMAL HIGH (ref 3.4–10.8)

## 2019-11-14 LAB — URINE CULTURE, OB REFLEX: Organism ID, Bacteria: NO GROWTH

## 2019-11-14 LAB — CULTURE, OB URINE

## 2019-11-15 LAB — CYTOLOGY - PAP
Comment: NEGATIVE
Diagnosis: NEGATIVE
High risk HPV: NEGATIVE

## 2019-11-19 ENCOUNTER — Encounter: Payer: Self-pay | Admitting: Obstetrics & Gynecology

## 2019-11-21 ENCOUNTER — Encounter: Payer: Self-pay | Admitting: Obstetrics & Gynecology

## 2019-11-21 DIAGNOSIS — Z3480 Encounter for supervision of other normal pregnancy, unspecified trimester: Secondary | ICD-10-CM

## 2019-12-10 ENCOUNTER — Encounter: Payer: Medicaid Other | Admitting: Family Medicine

## 2019-12-11 ENCOUNTER — Other Ambulatory Visit (HOSPITAL_COMMUNITY): Payer: Self-pay | Admitting: *Deleted

## 2019-12-11 ENCOUNTER — Other Ambulatory Visit: Payer: Self-pay | Admitting: Obstetrics & Gynecology

## 2019-12-11 ENCOUNTER — Other Ambulatory Visit: Payer: Self-pay

## 2019-12-11 ENCOUNTER — Ambulatory Visit (HOSPITAL_COMMUNITY)
Admission: RE | Admit: 2019-12-11 | Discharge: 2019-12-11 | Disposition: A | Discharge status: Home / Self Care | Payer: Medicaid Other | Source: Ambulatory Visit | Attending: Obstetrics & Gynecology | Admitting: Obstetrics & Gynecology

## 2019-12-11 DIAGNOSIS — Z3480 Encounter for supervision of other normal pregnancy, unspecified trimester: Secondary | ICD-10-CM

## 2019-12-11 DIAGNOSIS — Z3A17 17 weeks gestation of pregnancy: Secondary | ICD-10-CM

## 2019-12-11 DIAGNOSIS — O4400 Placenta previa specified as without hemorrhage, unspecified trimester: Secondary | ICD-10-CM

## 2019-12-11 DIAGNOSIS — O4440 Low lying placenta NOS or without hemorrhage, unspecified trimester: Secondary | ICD-10-CM

## 2019-12-11 DIAGNOSIS — O4402 Placenta previa specified as without hemorrhage, second trimester: Secondary | ICD-10-CM

## 2019-12-12 DIAGNOSIS — O4400 Placenta previa specified as without hemorrhage, unspecified trimester: Secondary | ICD-10-CM

## 2019-12-12 HISTORY — DX: Complete placenta previa nos or without hemorrhage, unspecified trimester: O44.00

## 2019-12-13 NOTE — L&D Delivery Note (Signed)
[facility]N [facility] Delivery Note  An Dozer is a 24 y.o. G3P1011 s/p NSVD at [redacted]w[redacted]d. She was admitted for IOL for GHTN.   ROM: 1h 54m with clear fluid GBS Status:  Negative/-- (05/06 1118)  Labor Progress: . Initial SVE: 3/70/-2. She then progressed to complete.   Delivery Date/Time: 04/30/2020 17:18 Delivery: Called to room and patient was complete and pushing. Head delivered ROA. No nuchal cord present, cord was found around feet. Shoulder and body delivered in usual fashion. Infant with spontaneous cry, placed on mother's abdomen, dried and stimulated. Cord clamped x 2 after 1-minute delay, and cut by mom. Cord blood drawn. Placenta delivered spontaneously with gentle cord traction. Fundus firm with massage and Pitocin. Labia, perineum, vagina, and cervix inspected inspected with bilateral periurethral lacerations.  Baby Weight: pending  Placenta: Sent to pathology Complications: None  Lacerations: Bilateral periurethral EBL: 250 mL Analgesia: Epidural   Infant:  APGAR (1 MIN): 8   APGAR (5 MINS): 9   APGAR (10 MINS):     Katherine Basset, DO OB Family Medicine, [facility] for [employer], [employer] [facility] 04/30/2020, 5:51 PM

## 2019-12-25 NOTE — Progress Notes (Signed)
  Subjective:    Roberta Alexander is a [id number] [redacted]w[redacted]d being seen 12/1 for her first obstetrical visit.  Her obstetrical history is significant for underweight. Patient does intend to breast feed. Pregnancy history fully reviewed.  Patient reports no complaints.  Vitals:   11/12/19 0949  BP: 104/69  Pulse: (!) 123  Weight: 105 lb 9.6 oz (47.9 kg)    HISTORY: OB History  Gravida Para Term Preterm AB Living  3 1 1   1 1   SAB TAB Ectopic Multiple Live Births          1    # Outcome Date GA Lbr Len/2nd Weight Sex Delivery Anes PTL Lv  3 Current           2 AB 03/2019 [redacted]w[redacted]d    TAB     1 Term 12/18/13 [redacted]w[redacted]d  7 lb 9 oz (3.43 kg) F Vag-Spont   LIV   Past Medical History:  Diagnosis Date  . Anemia    Past Surgical History:  Procedure Laterality Date  . INDUCED ABORTION     No family history on file.   Exam    Uterus:     Pelvic Exam:    Perineum: No Hemorrhoids   Vulva: normal   Vagina:  normal mucosa   pH:    Cervix: no lesions   Adnexa: no mass, fullness, tenderness   Bony Pelvis: average  System: Breast:  normal appearance, no masses or tenderness   Skin: normal coloration and turgor, no rashes    Neurologic: oriented, normal mood   Extremities: normal strength, tone, and muscle mass   HEENT PERRLA   Mouth/Teeth mucous membranes moist, pharynx normal without lesions and dental hygiene good   Neck supple   Cardiovascular: regular rate and rhythm   Respiratory:  appears well, vitals normal, no respiratory distress, acyanotic, normal RR, neck free of mass or lymphadenopathy, chest clear, no wheezing, crepitations, rhonchi, normal symmetric air entry   Abdomen: soft, non-tender; bowel sounds normal; no masses,  no organomegaly   Urinary: urethral meatus normal      Assessment:    Pregnancy: [id number] Patient Active Problem List   Diagnosis Date Noted  . Placenta previa antepartum 12/12/2019  . Mildly underweight adult 11/12/2019  . Supervision of other normal  pregnancy, antepartum 11/06/2019        Plan:     Initial labs drawn. Prenatal vitamins. Problem list reviewed and updated. Genetic Screening discussed  Ultrasound discussed; fetal survey: ordered.  Follow up in 4 weeks. 50% of 30 min visit spent on counseling and coordination of care.     [name]

## 2020-01-02 ENCOUNTER — Telehealth: Payer: Self-pay | Admitting: Family Medicine

## 2020-01-08 ENCOUNTER — Ambulatory Visit (HOSPITAL_COMMUNITY): Payer: Medicaid Other

## 2020-01-09 ENCOUNTER — Ambulatory Visit (INDEPENDENT_AMBULATORY_CARE_PROVIDER_SITE_OTHER): Payer: Medicaid Other

## 2020-01-09 ENCOUNTER — Other Ambulatory Visit: Payer: Self-pay

## 2020-01-09 VITALS — BP 100/63 | HR 108 | Wt 110.4 lb

## 2020-01-09 DIAGNOSIS — O99012 Anemia complicating pregnancy, second trimester: Secondary | ICD-10-CM

## 2020-01-09 DIAGNOSIS — O26892 Other specified pregnancy related conditions, second trimester: Secondary | ICD-10-CM

## 2020-01-09 DIAGNOSIS — Z3480 Encounter for supervision of other normal pregnancy, unspecified trimester: Secondary | ICD-10-CM

## 2020-01-09 DIAGNOSIS — R109 Unspecified abdominal pain: Secondary | ICD-10-CM

## 2020-01-09 DIAGNOSIS — Z3A22 22 weeks gestation of pregnancy: Secondary | ICD-10-CM

## 2020-01-09 HISTORY — DX: Anemia complicating pregnancy, second trimester: O99.012

## 2020-01-09 MED ORDER — COMFORT FIT MATERNITY SUPP LG MISC: 1.0000 [IU] | 0 refills | Status: DC | PRN

## 2020-01-09 MED ORDER — FERROUS SULFATE 325 (65 FE) MG PO TBEC: 325.0000 mg | DELAYED_RELEASE_TABLET | Freq: Two times a day (BID) | ORAL | 3 refills | Status: DC

## 2020-01-09 MED ORDER — DOCUSATE SODIUM 100 MG PO CAPS: 100.0000 mg | ORAL_CAPSULE | Freq: Every day | ORAL | 1 refills | Status: DC

## 2020-01-09 NOTE — Progress Notes (Signed)
Pt is here for ROB, [redacted]w[redacted]d.

## 2020-01-09 NOTE — Progress Notes (Signed)
   PRENATAL VISIT NOTE  Subjective:  Roberta Alexander is a 24 y.o. G3P1011 at [redacted]w[redacted]d who presents today for routine prenatal care.  She is currently being monitored for supervision of a low-risk pregnancy with problems as listed below.  Patient has no pregnancy related concerns and endorses fetal movement.  She denies vaginal concerns including discharge, bleeding, leaking, itching, and burning.   Patient states that she feels weak when she does extensive activities such as cleaning.  She states she also has some lower abdominal pain that she experiences after activities as well.  Patient does not have a belly support band.   Patient Active Problem List   Diagnosis Date Noted  . Placenta previa antepartum 12/12/2019  . Mildly underweight adult 11/12/2019  . Supervision of other normal pregnancy, antepartum 11/06/2019    The following portions of the patient's history were reviewed and updated as appropriate: allergies, current medications, past family history, past medical history, past social history, past surgical history and problem list. Problem list updated.  Objective:   Vitals:   01/09/20 0920  BP: 100/63  Pulse: (!) 108  Weight: 110 lb 6.4 oz (50.1 kg)    Fetal Status: Fetal Heart Rate (bpm): 160   Movement: Present     General:  Alert, oriented and cooperative. Patient is in no acute distress.  Skin: Skin is warm and dry.   Cardiovascular: Regular rate and rhythm.  Respiratory: Normal respiratory effort. CTA-Bilaterally  Abdomen: Soft, gravid, appropriate for gestational age.  Pelvic: Cervical exam deferred        Extremities: Normal range of motion.  Edema: None  Mental Status: Normal mood and affect. Normal behavior. Normal judgment and thought content.   Assessment and Plan:  Pregnancy: G3P1011 at [redacted]w[redacted]d  1. Supervision of other normal pregnancy, antepartum -Reviewed previous labs from initial ob. -Anticipatory guidance for upcoming appts. -Instructed to activate  mychart for anticipated virtual visits.   2. Anemia during pregnancy in second trimester -Discussed hemoglobin of 9.6 -Informed how this can contribute to feelings of weakness. -Start iron supplement daily. -Reviewed risks including constipation, which patient reports history of. -Will also send script for stool softener for daily usage.   3. Abdominal pain during pregnancy in second trimester -Discussed usage of pregnancy support band to assist with abdominal pain. -Reviewed concerning symptoms with pain including vaginal discharge, bleeding, or constant pain. -Rx given for maternity support band and faxed to appropriate pharmacy by office staff.    Preterm labor symptoms and general obstetric precautions including but not limited to vaginal bleeding, contractions, leaking of fluid and fetal movement were reviewed with the patient.  Please refer to After Visit Summary for other counseling recommendations.  Return in about 3 weeks (around 01/30/2020) for LR-ROB via Mychart.  Future Appointments  Date Time Provider [place]  01/17/2020  1:15 PM [facility]-MFC [place] 4 WH-MFCUS MFC-US  01/17/2020  1:30 PM [place] [facility]    Maryann Conners, CNM 01/09/2020, 9:38 AM

## 2020-01-09 NOTE — Patient Instructions (Signed)
Iron-Rich Diet  Iron is a mineral that helps your body to produce hemoglobin. Hemoglobin is a protein in red blood cells that carries oxygen to your body's tissues. Eating too little iron may cause you to feel weak and tired, and it can increase your risk of infection. Iron is naturally found in many foods, and many foods have iron added to them (iron-fortified foods). You may need to follow an iron-rich diet if you do not have enough iron in your body due to certain medical conditions. The amount of iron that you need each day depends on your age, your sex, and any medical conditions you have. Follow instructions from your health care provider or a diet and nutrition specialist (dietitian) about how much iron you should eat each day. What are tips for following this plan? Reading food labels  Check food labels to see how many milligrams (mg) of iron are in each serving. Cooking  Cook foods in pots and pans that are made from iron.  Take these steps to make it easier for your body to absorb iron from certain foods: ? Soak beans overnight before cooking. ? Soak whole grains overnight and drain them before using. ? Ferment flours before baking, such as by using yeast in bread dough. Meal planning  When you eat foods that contain iron, you should eat them with foods that are high in vitamin C. These include oranges, peppers, tomatoes, potatoes, and mango. Vitamin C helps your body to absorb iron. General information  Take iron supplements only as told by your health care provider. An overdose of iron can be life-threatening. If you were prescribed iron supplements, take them with orange juice or a vitamin C supplement.  When you eat iron-fortified foods or take an iron supplement, you should also eat foods that naturally contain iron, such as meat, poultry, and fish. Eating naturally iron-rich foods helps your body to absorb the iron that is added to other foods or contained in a  supplement.  Certain foods and drinks prevent your body from absorbing iron properly. Avoid eating these foods in the same meal as iron-rich foods or with iron supplements. These foods include: ? Coffee, black tea, and red wine. ? Milk, dairy products, and foods that are high in calcium. ? Beans and soybeans. ? Whole grains. What foods should I eat? Fruits Prunes. Raisins. Eat fruits high in vitamin C, such as oranges, grapefruits, and strawberries, alongside iron-rich foods. Vegetables Spinach (cooked). Green peas. Broccoli. Fermented vegetables. Eat vegetables high in vitamin C, such as leafy greens, potatoes, bell peppers, and tomatoes, alongside iron-rich foods. Grains Iron-fortified breakfast cereal. Iron-fortified whole-wheat bread. Enriched rice. Sprouted grains. Meats and other proteins Beef liver. Oysters. Beef. Shrimp. Turkey. Chicken. Tuna. Sardines. Chickpeas. Nuts. Tofu. Pumpkin seeds. Beverages Tomato juice. Fresh orange juice. Prune juice. Hibiscus tea. Fortified instant breakfast shakes. Sweets and desserts Blackstrap molasses. Seasonings and condiments Tahini. Fermented soy sauce. Other foods Wheat germ. The items listed above may not be a complete list of recommended foods and beverages. Contact a dietitian for more information. What foods should I avoid? Grains Whole grains. Bran cereal. Bran flour. Oats. Meats and other proteins Soybeans. Products made from soy protein. Black beans. Lentils. Mung beans. Split peas. Dairy Milk. Cream. Cheese. Yogurt. Cottage cheese. Beverages Coffee. Black tea. Red wine. Sweets and desserts Cocoa. Chocolate. Ice cream. Other foods Basil. Oregano. Large amounts of parsley. The items listed above may not be a complete list of foods and beverages to avoid.   Contact a dietitian for more information. Summary  Iron is a mineral that helps your body to produce hemoglobin. Hemoglobin is a protein in red blood cells that carries  oxygen to your body's tissues.  Iron is naturally found in many foods, and many foods have iron added to them (iron-fortified foods).  When you eat foods that contain iron, you should eat them with foods that are high in vitamin C. Vitamin C helps your body to absorb iron.  Certain foods and drinks prevent your body from absorbing iron properly, such as whole grains and dairy products. You should avoid eating these foods in the same meal as iron-rich foods or with iron supplements. This information is not intended to replace advice given to you by your health care provider. Make sure you discuss any questions you have with your health care provider. Document Revised: 11/10/2017 Document Reviewed: 10/24/2017 Elsevier Patient Education  [place]. [employer] Content in Foods  See the following list for the dietary fiber content of some common foods. High-fiber foods High-fiber foods contain 4 grams or more (4g or more) of fiber per serving. They include:  Artichoke (fresh) -- 1 medium has 10.3g of fiber.  Baked beans, plain or vegetarian (canned) --  cup has 5.2g of fiber.  Blackberries or raspberries (fresh) --  cup has 4g of fiber.  Bran cereal --  cup has 8.6g of fiber.  Bulgur (cooked) --  cup has 4g of fiber.  Kidney beans (canned) --  cup has 6.8g of fiber.  Lentils (cooked) --  cup has 7.8g of fiber.  Pear (fresh) -- 1 medium has 5.1g of fiber.  Peas (frozen) --  cup has 4.4g of fiber.  Pinto beans (canned) --  cup has 5.5g of fiber.  Pinto beans (dried and cooked) --  cup has 7.7g of fiber.  Potato with skin (baked) -- 1 medium has 4.4g of fiber.  Quinoa (cooked) --  cup has 5g of fiber.  Soybeans (canned, frozen, or fresh) --  cup has 5.1g of fiber. Moderate-fiber foods Moderate-fiber foods contain 1-4 grams (1-4g) of fiber per serving. They include:  Almonds -- 1 oz. has 3.5g of fiber.  Apple with skin -- 1 medium has 3.3g of  fiber.  Applesauce, sweetened --  cup has 1.5g of fiber.  Bagel, plain -- one 4-inch (10-cm) bagel has 2g of fiber.  Banana -- 1 medium has 3.1g of fiber.  Broccoli (cooked) --  cup has 2.5g of fiber.  Carrots (cooked) --  cup has 2.3g of fiber.  Corn (canned or frozen) --  cup has 2.1g of fiber.  Corn tortilla -- one 6-inch (15-cm) tortilla has 1.5g of fiber.  Green beans (canned) --  cup has 2g of fiber.  Instant oatmeal --  cup has about 2g of fiber.  Long-grain brown rice (cooked) -- 1 cup has 3.5g of fiber.  Macaroni, enriched (cooked) -- 1 cup has 2.5g of fiber.  Melon -- 1 cup has 1.4g of fiber.  Multigrain cereal --  cup has about 2-4g of fiber.  Orange -- 1 small has 3.1g of fiber.  Potatoes, mashed --  cup has 1.6g of fiber.  Raisins -- 1/4 cup has 1.6g of fiber.  Squash --  cup has 2.9g of fiber.  Sunflower seeds --  cup has 1.1g of fiber.  Tomato -- 1 medium has 1.5g of fiber.  Vegetable or soy patty -- 1 has 3.4g of fiber.  Whole-wheat bread -- 1 slice has  2g of fiber.  Whole-wheat spaghetti --  cup has 3.2g of fiber. Low-fiber foods Low-fiber foods contain less than 1 gram (less than 1g) of fiber per serving. They include:  Egg -- 1 large.  Flour tortilla -- one 6-inch (15-cm) tortilla.  Fruit juice --  cup.  Lettuce -- 1 cup.  Meat, poultry, or fish -- 1 oz.  Milk -- 1 cup.  Spinach (raw) -- 1 cup.  White bread -- 1 slice.  White rice --  cup.  Yogurt --  cup. Actual amounts of fiber in foods may be different depending on processing. Talk with your dietitian about how much fiber you need in your diet. This information is not intended to replace advice given to you by your health care provider. Make sure you discuss any questions you have with your health care provider. Document Revised: 07/21/2016 Document Reviewed: 01/21/2016 Elsevier Patient Education  [place].

## 2020-01-14 DIAGNOSIS — O339 Maternal care for disproportion, unspecified: Secondary | ICD-10-CM | POA: Diagnosis not present

## 2020-01-17 ENCOUNTER — Ambulatory Visit (HOSPITAL_COMMUNITY): Payer: Medicaid Other | Admitting: *Deleted

## 2020-01-17 ENCOUNTER — Ambulatory Visit (HOSPITAL_COMMUNITY)
Admission: RE | Admit: 2020-01-17 | Discharge: 2020-01-17 | Disposition: A | Discharge status: Home / Self Care | Payer: Medicaid Other | Source: Ambulatory Visit | Attending: Obstetrics | Admitting: Obstetrics

## 2020-01-17 ENCOUNTER — Encounter (HOSPITAL_COMMUNITY): Payer: Self-pay | Admitting: *Deleted

## 2020-01-17 ENCOUNTER — Other Ambulatory Visit (HOSPITAL_COMMUNITY): Payer: Self-pay | Admitting: *Deleted

## 2020-01-17 ENCOUNTER — Other Ambulatory Visit: Payer: Self-pay

## 2020-01-17 VITALS — BP 93/58 | HR 75 | Temp 98.2°F

## 2020-01-17 DIAGNOSIS — O4440 Low lying placenta NOS or without hemorrhage, unspecified trimester: Secondary | ICD-10-CM

## 2020-01-17 DIAGNOSIS — Z3A23 23 weeks gestation of pregnancy: Secondary | ICD-10-CM

## 2020-01-17 DIAGNOSIS — O4402 Placenta previa specified as without hemorrhage, second trimester: Secondary | ICD-10-CM | POA: Insufficient documentation

## 2020-01-17 DIAGNOSIS — O4442 Low lying placenta NOS or without hemorrhage, second trimester: Secondary | ICD-10-CM | POA: Insufficient documentation

## 2020-01-17 DIAGNOSIS — O4400 Placenta previa specified as without hemorrhage, unspecified trimester: Secondary | ICD-10-CM

## 2020-01-17 DIAGNOSIS — Z362 Encounter for other antenatal screening follow-up: Secondary | ICD-10-CM | POA: Diagnosis not present

## 2020-01-29 ENCOUNTER — Encounter: Payer: Self-pay | Admitting: Obstetrics

## 2020-01-29 ENCOUNTER — Telehealth (INDEPENDENT_AMBULATORY_CARE_PROVIDER_SITE_OTHER): Payer: Medicaid Other | Admitting: Obstetrics

## 2020-01-29 VITALS — BP 106/68 | HR 97

## 2020-01-29 DIAGNOSIS — Z3A24 24 weeks gestation of pregnancy: Secondary | ICD-10-CM

## 2020-01-29 DIAGNOSIS — Z3480 Encounter for supervision of other normal pregnancy, unspecified trimester: Secondary | ICD-10-CM

## 2020-01-29 DIAGNOSIS — O99012 Anemia complicating pregnancy, second trimester: Secondary | ICD-10-CM

## 2020-01-29 NOTE — Progress Notes (Signed)
TELEHEALTH OBSTETRICS PRENATAL VIRTUAL VIDEO VISIT ENCOUNTER NOTE  Provider location: [facility] [place]   I connected with Juleen Starr on 01/29/20 at  2:45 PM EST by [facility] MyChart Video Encounter at home and verified that I am speaking with the correct person using two identifiers.   I discussed the limitations, risks, security and privacy concerns of performing an evaluation and management service virtually and the availability of in person appointments. I also discussed with the patient that there may be a patient responsible charge related to this service. The patient expressed understanding and agreed to proceed. Subjective:  Roberta Alexander is a 24 y.o. G3P1011 at [redacted]w[redacted]d being seen today for ongoing prenatal care.  She is currently monitored for the following issues for this low-risk pregnancy and has Supervision of other normal pregnancy, antepartum; Mildly underweight adult; Placenta previa antepartum; and Anemia during pregnancy in second trimester on their problem list.  Patient reports backache.  Contractions: Not present. Vag. Bleeding: None.  Movement: Present. Denies any leaking of fluid.   The following portions of the patient's history were reviewed and updated as appropriate: allergies, current medications, past family history, past medical history, past social history, past surgical history and problem list.   Objective:   Vitals:   01/29/20 1157  BP: 106/68  Pulse: 97    Fetal Status:     Movement: Present     General:  Alert, oriented and cooperative. Patient is in no acute distress.  Respiratory: Normal respiratory effort, no problems with respiration noted  Mental Status: Normal mood and affect. Normal behavior. Normal judgment and thought content.  Rest of physical exam deferred due to type of encounter  Imaging: [place] MFM OB FOLLOW UP  Result Date: 01/17/2020 ----------------------------------------------------------------------  OBSTETRICS  REPORT                       (Signed Final 01/17/2020 01:57 pm) ---------------------------------------------------------------------- Patient Info  ID #:       [id number]                          D.O.B.:  08-23-1996 (24 yrs)  Name:       Roberta Alexander                    Visit Date: 01/17/2020 01:02 pm ---------------------------------------------------------------------- Performed By  Performed By:     Jeanene Erb BS,      Ref. Address:     [address]                    [place], [place]  [place]  Attending:        Johnell Comings MD         [facility]  Referred By:      Woodroe Mode                    MD ---------------------------------------------------------------------- Orders   #  Description                          Code         Ordered By   1  [place] MFM OB FOLLOW UP                  [phone]     YU FANG  ----------------------------------------------------------------------   #  Order #                    Accession #                 Episode #   1  [id number]                  [id number]                  [id number]  ---------------------------------------------------------------------- Indications   Placenta previa specified as without           O44.02   hemorrhage, second trimester   Anemia during pregnancy in second trimester    O99.012   Low Risk NIPS (Negative Horizon)   Encounter for other antenatal screening        Z36.2   follow-up   [redacted] weeks gestation of pregnancy                Z3A.23  ---------------------------------------------------------------------- Fetal Evaluation  Num Of Fetuses:         1  Fetal Heart Rate(bpm):  151  Cardiac Activity:       Observed  Presentation:           Breech  Placenta:               Anterior,  low-lying  P. Cord Insertion:      Previously Visualized  Amniotic Fluid  AFI FV:      Within normal limits                              Largest Pocket(cm)                              4.4 ---------------------------------------------------------------------- Biometry  BPD:      55.6  mm     G. Age:  23w 0d         38  %    CI:        75.97   %    70 - 86  FL/HC:      19.8   %    19.2 - 20.8  HC:      202.2  mm     G. Age:  22w 3d         11  %    HC/AC:      1.05        1.05 - 1.21  AC:      191.9  mm     G. Age:  23w 6d         67  %    FL/BPD:     71.9   %    71 - 87  FL:         40  mm     G. Age:  22w 6d         31  %    FL/AC:      20.8   %    20 - 24  Est. FW:     585  gm      1 lb 5 oz     52  % ---------------------------------------------------------------------- OB History  Gravidity:    3         Term:   1        Prem:   0        SAB:   0  TOP:          1       Ectopic:  0        Living: 1 ---------------------------------------------------------------------- Gestational Age  LMP:           24w 1d        Date:  08/01/19                 EDD:   05/07/20  U/S Today:     23w 0d                                        EDD:   05/15/20  Best:          23w 1d     Det. By:  U/S  (12/11/19)          EDD:   05/14/20 ---------------------------------------------------------------------- Anatomy  Cranium:               Appears normal         Aortic Arch:            Previously seen  Cavum:                 Appears normal         Ductal Arch:            Previously seen  Ventricles:            Appears normal         Diaphragm:              Appears normal  Choroid Plexus:        Previously seen        Stomach:                Appears normal, left  sided  Cerebellum:            Appears normal         Abdomen:                Appears normal  Posterior Fossa:       Previously seen        Abdominal Wall:          Previously seen  Nuchal Fold:           Previously seen        Cord Vessels:           Appears normal (3                                                                        vessel cord)  Face:                  Orbits and profile     Kidneys:                Appear normal                         previously seen  Lips:                  Previously seen        Bladder:                Appears normal  Thoracic:              Appears normal         Spine:                  Previously seen  Heart:                 Appears normal         Upper Extremities:      Previously seen                         (4CH, axis, and                         situs)  RVOT:                  Previously seen        Lower Extremities:      Previously seen  LVOT:                  Previously seen  Other:  Female gender Heels, 5th digit, and Nasal bone previously visualized. ---------------------------------------------------------------------- Cervix Uterus Adnexa  Cervix  Length:            3.5  cm.  Normal appearance by transabdominal scan. ---------------------------------------------------------------------- Comments  This patient was seen for a follow up growth scan due to a  placenta previa noted during her prior ultrasound exam and  also to confirm her dates.  The patient denies any problems  since her last exam.  She was informed that the fetal growth and amniotic fluid  level appears appropriate for her gestational age.  The fetal  biometry measurements  are consistent with an [facility] of May 14, 2020.  This date should be used as her final EDC.  The placenta is now a marginal previa.  The patient was  advised that the placenta will most likely move away from the  cervix later in her pregnancy.  A follow up exam was scheduled in 6 weeks to assess the  placental location. ----------------------------------------------------------------------                   Johnell Comings, MD Electronically Signed Final Report   01/17/2020 01:57 pm  ----------------------------------------------------------------------   Assessment and Plan:  Pregnancy: G3P1011 at [redacted]w[redacted]d 1. Supervision of other normal pregnancy, antepartum  2. Anemia during pregnancy in second trimester   Preterm labor symptoms and general obstetric precautions including but not limited to vaginal bleeding, contractions, leaking of fluid and fetal movement were reviewed in detail with the patient. I discussed the assessment and treatment plan with the patient. The patient was provided an opportunity to ask questions and all were answered. The patient agreed with the plan and demonstrated an understanding of the instructions. The patient was advised to call back or seek an in-person office evaluation/go to [facility] at [facility] for any urgent or concerning symptoms. Please refer to After Visit Summary for other counseling recommendations.   I provided 10 minutes of face-to-face time during this encounter.  Return in about 4 weeks (around 02/26/2020) for ROB, 2 hour OGTT.  Future Appointments  Date Time Provider [place]  01/29/2020  2:45 PM Shelly Bombard, MD [facility]  02/21/2020 10:15 AM [place] NURSE [place] [facility]  02/21/2020 10:15 AM [place] 4 WH-MFCUS [facility]-US    Baltazar Najjar, [facility], [facility] 01/29/2020

## 2020-02-21 ENCOUNTER — Other Ambulatory Visit: Payer: Self-pay

## 2020-02-21 ENCOUNTER — Encounter (HOSPITAL_COMMUNITY): Payer: Self-pay

## 2020-02-21 ENCOUNTER — Ambulatory Visit (HOSPITAL_COMMUNITY)
Admission: RE | Admit: 2020-02-21 | Discharge: 2020-02-21 | Disposition: A | Discharge status: Home / Self Care | Payer: Medicaid Other | Source: Ambulatory Visit | Attending: Obstetrics and Gynecology | Admitting: Obstetrics and Gynecology

## 2020-02-21 ENCOUNTER — Other Ambulatory Visit (HOSPITAL_COMMUNITY): Payer: Self-pay | Admitting: *Deleted

## 2020-02-21 ENCOUNTER — Ambulatory Visit (HOSPITAL_COMMUNITY): Payer: Medicaid Other | Admitting: *Deleted

## 2020-02-21 VITALS — BP 106/60 | HR 91 | Temp 97.8°F

## 2020-02-21 DIAGNOSIS — O4420 Partial placenta previa NOS or without hemorrhage, unspecified trimester: Secondary | ICD-10-CM

## 2020-02-21 DIAGNOSIS — Z362 Encounter for other antenatal screening follow-up: Secondary | ICD-10-CM | POA: Insufficient documentation

## 2020-02-21 DIAGNOSIS — O365990 Maternal care for other known or suspected poor fetal growth, unspecified trimester, not applicable or unspecified: Secondary | ICD-10-CM

## 2020-02-21 DIAGNOSIS — O4423 Partial placenta previa NOS or without hemorrhage, third trimester: Secondary | ICD-10-CM

## 2020-02-21 DIAGNOSIS — O99213 Obesity complicating pregnancy, third trimester: Secondary | ICD-10-CM

## 2020-02-21 DIAGNOSIS — Z3A28 28 weeks gestation of pregnancy: Secondary | ICD-10-CM

## 2020-03-06 ENCOUNTER — Ambulatory Visit (HOSPITAL_COMMUNITY): Payer: Medicaid Other

## 2020-03-09 ENCOUNTER — Encounter (HOSPITAL_COMMUNITY): Payer: Self-pay

## 2020-03-09 ENCOUNTER — Ambulatory Visit (HOSPITAL_COMMUNITY): Payer: Medicaid Other | Admitting: *Deleted

## 2020-03-09 ENCOUNTER — Other Ambulatory Visit: Payer: Self-pay

## 2020-03-09 ENCOUNTER — Ambulatory Visit (HOSPITAL_COMMUNITY)
Admission: RE | Admit: 2020-03-09 | Discharge: 2020-03-09 | Disposition: A | Discharge status: Home / Self Care | Payer: Medicaid Other | Source: Ambulatory Visit | Attending: Obstetrics | Admitting: Obstetrics

## 2020-03-09 VITALS — BP 108/66 | HR 82 | Temp 97.6°F

## 2020-03-09 DIAGNOSIS — O4440 Low lying placenta NOS or without hemorrhage, unspecified trimester: Secondary | ICD-10-CM | POA: Diagnosis not present

## 2020-03-09 DIAGNOSIS — O365990 Maternal care for other known or suspected poor fetal growth, unspecified trimester, not applicable or unspecified: Secondary | ICD-10-CM | POA: Diagnosis not present

## 2020-03-09 DIAGNOSIS — O365931 Maternal care for other known or suspected poor fetal growth, third trimester, fetus 1: Secondary | ICD-10-CM | POA: Diagnosis not present

## 2020-03-09 DIAGNOSIS — Z362 Encounter for other antenatal screening follow-up: Secondary | ICD-10-CM

## 2020-03-09 DIAGNOSIS — Z3A30 30 weeks gestation of pregnancy: Secondary | ICD-10-CM

## 2020-03-09 DIAGNOSIS — O99013 Anemia complicating pregnancy, third trimester: Secondary | ICD-10-CM

## 2020-03-10 ENCOUNTER — Ambulatory Visit (HOSPITAL_COMMUNITY): Payer: Medicaid Other | Attending: Obstetrics and Gynecology

## 2020-03-17 ENCOUNTER — Ambulatory Visit (HOSPITAL_COMMUNITY): Payer: Medicaid Other | Admitting: *Deleted

## 2020-03-17 ENCOUNTER — Encounter (HOSPITAL_COMMUNITY): Payer: Self-pay | Admitting: *Deleted

## 2020-03-17 ENCOUNTER — Other Ambulatory Visit: Payer: Self-pay

## 2020-03-17 ENCOUNTER — Ambulatory Visit (HOSPITAL_COMMUNITY)
Admission: RE | Admit: 2020-03-17 | Discharge: 2020-03-17 | Disposition: A | Discharge status: Home / Self Care | Payer: Medicaid Other | Source: Ambulatory Visit | Attending: Obstetrics | Admitting: Obstetrics

## 2020-03-17 ENCOUNTER — Other Ambulatory Visit (HOSPITAL_COMMUNITY): Payer: Self-pay | Admitting: Obstetrics

## 2020-03-17 VITALS — BP 115/69 | HR 78 | Temp 97.9°F

## 2020-03-17 DIAGNOSIS — O4443 Low lying placenta NOS or without hemorrhage, third trimester: Secondary | ICD-10-CM | POA: Diagnosis not present

## 2020-03-17 DIAGNOSIS — O365990 Maternal care for other known or suspected poor fetal growth, unspecified trimester, not applicable or unspecified: Secondary | ICD-10-CM

## 2020-03-17 DIAGNOSIS — Z362 Encounter for other antenatal screening follow-up: Secondary | ICD-10-CM

## 2020-03-17 DIAGNOSIS — O99013 Anemia complicating pregnancy, third trimester: Secondary | ICD-10-CM

## 2020-03-17 DIAGNOSIS — O4440 Low lying placenta NOS or without hemorrhage, unspecified trimester: Secondary | ICD-10-CM

## 2020-03-17 DIAGNOSIS — Z3A31 31 weeks gestation of pregnancy: Secondary | ICD-10-CM | POA: Diagnosis not present
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
## 2020-03-18 ENCOUNTER — Encounter (HOSPITAL_BASED_OUTPATIENT_CLINIC_OR_DEPARTMENT_OTHER): Payer: Self-pay

## 2020-03-18 ENCOUNTER — Other Ambulatory Visit (HOSPITAL_COMMUNITY): Payer: Self-pay | Admitting: *Deleted

## 2020-03-18 ENCOUNTER — Other Ambulatory Visit: Payer: Self-pay

## 2020-03-18 ENCOUNTER — Emergency Department (HOSPITAL_BASED_OUTPATIENT_CLINIC_OR_DEPARTMENT_OTHER)
Admission: EM | Admit: 2020-03-18 | Discharge: 2020-03-18 | Disposition: A | Discharge status: Home / Self Care | Payer: Medicaid Other | Attending: Emergency Medicine | Admitting: Emergency Medicine

## 2020-03-18 DIAGNOSIS — Z87891 Personal history of nicotine dependence: Secondary | ICD-10-CM | POA: Diagnosis not present

## 2020-03-18 DIAGNOSIS — Y9389 Activity, other specified: Secondary | ICD-10-CM | POA: Diagnosis not present

## 2020-03-18 DIAGNOSIS — S39012A Strain of muscle, fascia and tendon of lower back, initial encounter: Secondary | ICD-10-CM | POA: Diagnosis not present

## 2020-03-18 DIAGNOSIS — O9A213 Injury, poisoning and certain other consequences of external causes complicating pregnancy, third trimester: Secondary | ICD-10-CM | POA: Insufficient documentation

## 2020-03-18 DIAGNOSIS — S301XXA Contusion of abdominal wall, initial encounter: Secondary | ICD-10-CM | POA: Insufficient documentation

## 2020-03-18 DIAGNOSIS — Y998 Other external cause status: Secondary | ICD-10-CM | POA: Diagnosis not present

## 2020-03-18 DIAGNOSIS — Z3493 Encounter for supervision of normal pregnancy, unspecified, third trimester: Secondary | ICD-10-CM | POA: Insufficient documentation

## 2020-03-18 DIAGNOSIS — Y92410 Unspecified street and highway as the place of occurrence of the external cause: Secondary | ICD-10-CM | POA: Diagnosis not present

## 2020-03-18 DIAGNOSIS — Z79899 Other long term (current) drug therapy: Secondary | ICD-10-CM | POA: Insufficient documentation

## 2020-03-18 DIAGNOSIS — Z3A32 32 weeks gestation of pregnancy: Secondary | ICD-10-CM | POA: Insufficient documentation

## 2020-03-18 DIAGNOSIS — O365930 Maternal care for other known or suspected poor fetal growth, third trimester, not applicable or unspecified: Secondary | ICD-10-CM

## 2020-03-18 LAB — COMPREHENSIVE METABOLIC PANEL
ALT: 10 U/L (ref 0–44)
AST: 21 U/L (ref 15–41)
Albumin: 3.1 g/dL — ABNORMAL LOW (ref 3.5–5.0)
Alkaline Phosphatase: 99 U/L (ref 38–126)
Anion gap: 6 (ref 5–15)
BUN: 7 mg/dL (ref 6–20)
CO2: 24 mmol/L (ref 22–32)
Calcium: 8.6 mg/dL — ABNORMAL LOW (ref 8.9–10.3)
Chloride: 103 mmol/L (ref 98–111)
Creatinine, Ser: 0.57 mg/dL (ref 0.44–1.00)
GFR calc Af Amer: >60 mL/min (ref >60)
GFR calc non Af Amer: >60 mL/min (ref >60)
Glucose, Bld: 81 mg/dL (ref 70–99)
Potassium: 3.4 mmol/L — ABNORMAL LOW (ref 3.5–5.1)
Sodium: 133 mmol/L — ABNORMAL LOW (ref 135–145)
Total Bilirubin: 0.4 mg/dL (ref 0.3–1.2)
Total Protein: 6.7 g/dL (ref 6.5–8.1)

## 2020-03-18 LAB — CBC WITH DIFFERENTIAL/PLATELET
Abs Immature Granulocytes: 0.04 10*3/uL (ref 0.00–0.07)
Basophils Absolute: 0 10*3/uL (ref 0.0–0.1)
Basophils Relative: 0 %
Eosinophils Absolute: 0.1 10*3/uL (ref 0.0–0.5)
Eosinophils Relative: 1 %
HCT: 29.3 % — ABNORMAL LOW (ref 36.0–46.0)
Hemoglobin: 8.7 g/dL — ABNORMAL LOW (ref 12.0–15.0)
Immature Granulocytes: 1 %
Lymphocytes Relative: 21 %
Lymphs Abs: 1.9 10*3/uL (ref 0.7–4.0)
MCH: 25.3 pg — ABNORMAL LOW (ref 26.0–34.0)
MCHC: 29.7 g/dL — ABNORMAL LOW (ref 30.0–36.0)
MCV: 85.2 fL (ref 80.0–100.0)
Monocytes Absolute: 0.6 10*3/uL (ref 0.1–1.0)
Monocytes Relative: 7 %
Neutro Abs: 6.1 10*3/uL (ref 1.7–7.7)
Neutrophils Relative %: 70 %
Platelets: 237 10*3/uL (ref 150–400)
RBC: 3.44 MIL/uL — ABNORMAL LOW (ref 3.87–5.11)
RDW: 17 % — ABNORMAL HIGH (ref 11.5–15.5)
WBC: 8.7 10*3/uL (ref 4.0–10.5)
nRBC: 0 % (ref 0.0–0.2)

## 2020-03-18 LAB — URINALYSIS, ROUTINE W REFLEX MICROSCOPIC
Bilirubin Urine: NEGATIVE
Glucose, UA: NEGATIVE mg/dL
Hgb urine dipstick: NEGATIVE
Ketones, ur: NEGATIVE mg/dL
Nitrite: NEGATIVE
Protein, ur: NEGATIVE mg/dL
Specific Gravity, Urine: 1.01 (ref 1.005–1.030)
pH: 6.5 (ref 5.0–8.0)

## 2020-03-18 LAB — URINALYSIS, MICROSCOPIC (REFLEX)
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
## 2020-03-18 NOTE — Discharge Instructions (Addendum)
Follow-up with your OB/GYN in the next few days.  Take Tylenol 1000 mg every 8 hours as needed for pain.
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
## 2020-03-18 NOTE — ED Provider Notes (Signed)
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  Other:  Heels, 5th digit, and Nasal bone previously visualized. ---------------------------------------------------------------------- Doppler - Fetal Vessels  Umbilical Artery   S/D     %tile                                            ADFV    RDFV  3.23        78                                                No      No ---------------------------------------------------------------------- Cervix Uterus Adnexa  Cervix  Not visualized (advanced GA >24wks)  Uterus  No abnormality visualized.  Left Ovary  Not visualized.  Right Ovary  Not visualized.  Cul De Sac  No free fluid seen.  Adnexa  No abnormality visualized. ---------------------------------------------------------------------- Impression  Patient with fetal growth restriction return for fetal growth  assessment and antenatal testing.  On ultrasound, the estimated fetal weight is at the 8  percentile.  Interval weight gain is 525 g over 3 weeks.  Amniotic fluid is normal good fetal activity seen.  Umbilical  artery Doppler showed normal forward diastolic flow.  Antenatal testing is reassuring.  BPP 8/8.  I reassured the patient of the findings and explained the  importance of weekly antenatal testing.  I encouraged her to screen for gestational diabetes. ---------------------------------------------------------------------- Recommendations  -Weekly BPP and UA Doppler till delivery.  -Fetal growth assessment in 3 weeks. ----------------------------------------------------------------------                  Tama High, MD Electronically Signed Final Report   03/17/2020 04:49 pm ----------------------------------------------------------------------   Procedures Procedures (including critical care time)  Medications Ordered in ED Medications - No data to display  ED Course  I have reviewed the triage vital signs and the nursing notes.  Pertinent labs & imaging results that were available during my care of the patient were reviewed by me and considered in my medical decision making (see chart for details).    MDM Rules/Calculators/A&P  Patient presents here with the above complaints as described in the HPI.  She appears clinically well and vitals are stable.  She is complaining of lower abdominal  discomfort, however this has been ongoing for the past 5 days.  She was monitored on the toco machine and is now cleared from an OB standpoint.  I see no indication for imaging studies.  Patient has free range of motion of her neck and back and I highly doubt any fractures.  I also highly doubt any significant intra-abdominal trauma.  Her injury occurred 4 days ago and she remained stable.  Patient to be discharged.  Final Clinical Impression(s) / ED Diagnoses Final diagnoses:  None    Rx / DC Orders ED Discharge Orders    None       Veryl Speak, MD 03/18/20 2052

## 2020-03-18 NOTE — ED Notes (Signed)
Ob rapid response called with " all clear for d/c" md made aware

## 2020-03-18 NOTE — ED Notes (Signed)
ED Provider at bedside. 

## 2020-03-18 NOTE — ED Notes (Signed)
Rapid response OB  Called with pt info

## 2020-03-24 ENCOUNTER — Other Ambulatory Visit: Payer: Self-pay

## 2020-03-24 ENCOUNTER — Other Ambulatory Visit (HOSPITAL_COMMUNITY): Payer: Self-pay | Admitting: Obstetrics and Gynecology

## 2020-03-24 ENCOUNTER — Ambulatory Visit (HOSPITAL_COMMUNITY): Payer: Medicaid Other | Admitting: *Deleted

## 2020-03-24 ENCOUNTER — Encounter (HOSPITAL_COMMUNITY): Payer: Self-pay | Admitting: *Deleted

## 2020-03-24 ENCOUNTER — Ambulatory Visit (HOSPITAL_COMMUNITY)
Admission: RE | Admit: 2020-03-24 | Discharge: 2020-03-24 | Disposition: A | Discharge status: Home / Self Care | Payer: Medicaid Other | Source: Ambulatory Visit | Attending: Obstetrics and Gynecology | Admitting: Obstetrics and Gynecology

## 2020-03-24 DIAGNOSIS — O365930 Maternal care for other known or suspected poor fetal growth, third trimester, not applicable or unspecified: Secondary | ICD-10-CM

## 2020-03-24 DIAGNOSIS — D649 Anemia, unspecified: Secondary | ICD-10-CM

## 2020-03-24 DIAGNOSIS — O99013 Anemia complicating pregnancy, third trimester: Secondary | ICD-10-CM

## 2020-03-24 DIAGNOSIS — Z3A32 32 weeks gestation of pregnancy: Secondary | ICD-10-CM | POA: Diagnosis not present

## 2020-03-24 NOTE — Procedures (Signed)
Neisha Reames 02-18-1996 [redacted]w[redacted]d  Fetus A Non-Stress Test Interpretation for 03/24/20  Indication: Unsatisfactory BPP, IUGR  Fetal Heart Rate A Mode: External Baseline Rate (A): 150 bpm Variability: Moderate Accelerations: 10 x 10, 15 x 15 Decelerations: Variable Multiple birth?: No  Uterine Activity Mode: Palpation, Toco Contraction Frequency (min): Occas. Contraction Quality: Mild Resting Tone Palpated: Relaxed Resting Time: Adequate  Interpretation (Fetal Testing) Nonstress Test Interpretation: Reactive Overall Impression: Reassuring for gestational age Comments: Tracing reviewed by Dr. Gertie Exon

## 2020-03-31 ENCOUNTER — Encounter (HOSPITAL_COMMUNITY): Payer: Self-pay

## 2020-03-31 ENCOUNTER — Other Ambulatory Visit: Payer: Self-pay

## 2020-03-31 ENCOUNTER — Ambulatory Visit (HOSPITAL_COMMUNITY)
Admission: RE | Admit: 2020-03-31 | Discharge: 2020-03-31 | Disposition: A | Discharge status: Home / Self Care | Payer: Medicaid Other | Source: Ambulatory Visit | Attending: Obstetrics and Gynecology | Admitting: Obstetrics and Gynecology

## 2020-03-31 ENCOUNTER — Ambulatory Visit (HOSPITAL_COMMUNITY): Payer: Medicaid Other | Admitting: *Deleted

## 2020-03-31 ENCOUNTER — Other Ambulatory Visit (HOSPITAL_COMMUNITY): Payer: Self-pay | Admitting: *Deleted

## 2020-03-31 VITALS — BP 116/68 | HR 81 | Temp 97.8°F

## 2020-03-31 DIAGNOSIS — O365930 Maternal care for other known or suspected poor fetal growth, third trimester, not applicable or unspecified: Secondary | ICD-10-CM

## 2020-03-31 DIAGNOSIS — O4443 Low lying placenta NOS or without hemorrhage, third trimester: Secondary | ICD-10-CM

## 2020-03-31 DIAGNOSIS — O99013 Anemia complicating pregnancy, third trimester: Secondary | ICD-10-CM

## 2020-03-31 DIAGNOSIS — Z3A33 33 weeks gestation of pregnancy: Secondary | ICD-10-CM

## 2020-03-31 DIAGNOSIS — D649 Anemia, unspecified: Secondary | ICD-10-CM

## 2020-04-03 ENCOUNTER — Encounter (HOSPITAL_COMMUNITY): Payer: Self-pay

## 2020-04-07 ENCOUNTER — Ambulatory Visit (HOSPITAL_COMMUNITY)
Admission: RE | Admit: 2020-04-07 | Discharge: 2020-04-07 | Disposition: A | Discharge status: Home / Self Care | Payer: Medicaid Other | Source: Ambulatory Visit | Attending: Obstetrics and Gynecology | Admitting: Obstetrics and Gynecology

## 2020-04-07 ENCOUNTER — Other Ambulatory Visit: Payer: Self-pay

## 2020-04-07 ENCOUNTER — Encounter (HOSPITAL_COMMUNITY): Payer: Self-pay

## 2020-04-07 ENCOUNTER — Ambulatory Visit (HOSPITAL_COMMUNITY): Payer: Medicaid Other | Admitting: *Deleted

## 2020-04-07 VITALS — BP 117/68 | HR 83 | Temp 97.6°F

## 2020-04-07 DIAGNOSIS — O365930 Maternal care for other known or suspected poor fetal growth, third trimester, not applicable or unspecified: Secondary | ICD-10-CM

## 2020-04-07 DIAGNOSIS — O99013 Anemia complicating pregnancy, third trimester: Secondary | ICD-10-CM | POA: Diagnosis not present

## 2020-04-07 DIAGNOSIS — O4443 Low lying placenta NOS or without hemorrhage, third trimester: Secondary | ICD-10-CM | POA: Diagnosis not present

## 2020-04-07 DIAGNOSIS — D649 Anemia, unspecified: Secondary | ICD-10-CM

## 2020-04-07 DIAGNOSIS — Z3A34 34 weeks gestation of pregnancy: Secondary | ICD-10-CM | POA: Diagnosis not present

## 2020-04-16 ENCOUNTER — Other Ambulatory Visit (HOSPITAL_COMMUNITY)
Admission: RE | Admit: 2020-04-16 | Discharge: 2020-04-16 | Disposition: A | Discharge status: Home / Self Care | Payer: Medicaid Other | Source: Ambulatory Visit | Attending: Obstetrics & Gynecology | Admitting: Obstetrics & Gynecology

## 2020-04-16 ENCOUNTER — Ambulatory Visit (HOSPITAL_COMMUNITY): Payer: Medicaid Other | Attending: Maternal & Fetal Medicine

## 2020-04-16 ENCOUNTER — Ambulatory Visit: Payer: Medicaid Other | Admitting: *Deleted

## 2020-04-16 ENCOUNTER — Ambulatory Visit (INDEPENDENT_AMBULATORY_CARE_PROVIDER_SITE_OTHER): Payer: Medicaid Other | Admitting: Obstetrics & Gynecology

## 2020-04-16 ENCOUNTER — Other Ambulatory Visit: Payer: Self-pay | Admitting: Obstetrics & Gynecology

## 2020-04-16 ENCOUNTER — Other Ambulatory Visit: Payer: Self-pay

## 2020-04-16 VITALS — BP 115/72 | HR 92 | Wt 123.0 lb

## 2020-04-16 VITALS — BP 119/81 | HR 80 | Temp 97.6°F

## 2020-04-16 DIAGNOSIS — D649 Anemia, unspecified: Secondary | ICD-10-CM | POA: Diagnosis not present

## 2020-04-16 DIAGNOSIS — O365930 Maternal care for other known or suspected poor fetal growth, third trimester, not applicable or unspecified: Secondary | ICD-10-CM

## 2020-04-16 DIAGNOSIS — O99013 Anemia complicating pregnancy, third trimester: Secondary | ICD-10-CM

## 2020-04-16 DIAGNOSIS — Z3480 Encounter for supervision of other normal pregnancy, unspecified trimester: Secondary | ICD-10-CM

## 2020-04-16 DIAGNOSIS — Z3A36 36 weeks gestation of pregnancy: Secondary | ICD-10-CM | POA: Diagnosis not present

## 2020-04-16 NOTE — Progress Notes (Addendum)
   PRENATAL VISIT NOTE  Subjective:  Roberta Alexander is a 24 y.o. G3P1011 at [redacted]w[redacted]d being seen today for ongoing prenatal care.  She is currently monitored for the following issues for this low-risk pregnancy and has Supervision of other normal pregnancy, antepartum; Mildly underweight adult; Placenta previa antepartum; and Anemia during pregnancy in second trimester on their problem list.  Patient reports backache, fatigue, no contractions and no leaking.  Contractions: Not present. Vag. Bleeding: None.  Movement: Present. Denies leaking of fluid.   The following portions of the patient's history were reviewed and updated as appropriate: allergies, current medications, past family history, past medical history, past social history, past surgical history and problem list.   Objective:   Vitals:   04/16/20 1022  BP: 115/72  Pulse: 92  Weight: 123 lb (55.8 kg)    Fetal Status: Fetal Heart Rate (bpm): 156   Movement: Present     General:  Alert, oriented and cooperative. Patient is in no acute distress.  Skin: Skin is warm and dry. No rash noted.   Cardiovascular: Normal heart rate noted  Respiratory: Normal respiratory effort, no problems with respiration noted  Abdomen: Soft, gravid, appropriate for gestational age.  Pain/Pressure: Absent     Pelvic: Cervical exam performed in the presence of a chaperone  1.5cm, soft      Extremities: Normal range of motion.     Mental Status: Normal mood and affect. Normal behavior. Normal judgment and thought content.   Assessment and Plan:  Pregnancy: G3P1011 at [redacted]w[redacted]d 1. Supervision of growth restricted pregnancy, antepartum - Continue weekly BPP with MFM.  - Plan for induction at 38 weeks. - F/u in one week.  Preterm labor symptoms and general obstetric precautions including but not limited to vaginal bleeding, contractions, leaking of fluid and fetal movement were reviewed in detail with the patient. Please refer to After Visit Summary for other  counseling recommendations.   No follow-ups on file.  Future Appointments  Date Time Provider [place]  04/16/2020 11:15 AM Woodroe Mode, MD [place] None  04/23/2020  8:00 AM [facility] US1 [facility]  04/30/2020  8:00 AM [facility] US1 [facility]  05/07/2020  8:00 AM [facility] US1 [facility] [place], Medical Student Agree with above note  Woodroe Mode, MD

## 2020-04-16 NOTE — Patient Instructions (Signed)

## 2020-04-16 NOTE — Progress Notes (Signed)
Patient reports fetal movement, denies pain. Pt has not had an office visit since 01-29-20.

## 2020-04-17 LAB — CERVICOVAGINAL ANCILLARY ONLY
Bacterial Vaginitis (gardnerella): POSITIVE — AB
Candida Glabrata: NEGATIVE
Candida Vaginitis: POSITIVE — AB
Chlamydia: NEGATIVE
Comment: NEGATIVE
Comment: NEGATIVE
Comment: NEGATIVE
Comment: NEGATIVE
Comment: NEGATIVE
Comment: NORMAL
Neisseria Gonorrhea: NEGATIVE
Trichomonas: NEGATIVE

## 2020-04-17 LAB — CBC
Hematocrit: 26.1 % — ABNORMAL LOW (ref 34.0–46.6)
Hemoglobin: 7.8 g/dL — ABNORMAL LOW (ref 11.1–15.9)
MCH: 24 pg — ABNORMAL LOW (ref 26.6–33.0)
MCHC: 29.9 g/dL — ABNORMAL LOW (ref 31.5–35.7)
MCV: 80 fL (ref 79–97)
Platelets: 275 10*3/uL (ref 150–450)
RBC: 3.25 x10E6/uL — ABNORMAL LOW (ref 3.77–5.28)
RDW: 15.8 % — ABNORMAL HIGH (ref 11.7–15.4)
WBC: 7.7 10*3/uL (ref 3.4–10.8)

## 2020-04-17 LAB — RPR: RPR Ser Ql: NONREACTIVE

## 2020-04-17 LAB — GLUCOSE, 1 HOUR GESTATIONAL: Gestational Diabetes Screen: 115 mg/dL (ref 65–139)

## 2020-04-17 LAB — HIV ANTIBODY (ROUTINE TESTING W REFLEX): HIV Screen 4th Generation wRfx: NONREACTIVE

## 2020-04-17 NOTE — Progress Notes (Signed)
She is severely anemic and need to have Ferriheme infusion scheduled

## 2020-04-19 ENCOUNTER — Other Ambulatory Visit: Payer: Self-pay | Admitting: Obstetrics & Gynecology

## 2020-04-19 DIAGNOSIS — Z3480 Encounter for supervision of other normal pregnancy, unspecified trimester: Secondary | ICD-10-CM

## 2020-04-19 MED ORDER — FLUCONAZOLE 150 MG PO TABS: 150.0000 mg | ORAL_TABLET | Freq: Once | ORAL | 0 refills | Status: AC

## 2020-04-19 MED ORDER — METRONIDAZOLE 500 MG PO TABS: 500.0000 mg | ORAL_TABLET | Freq: Two times a day (BID) | ORAL | 0 refills | Status: AC

## 2020-04-19 NOTE — Progress Notes (Signed)
Meds ordered this encounter  Medications   metroNIDAZOLE (FLAGYL) 500 MG tablet    Sig: Take 1 tablet (500 mg total) by mouth 2 (two) times daily for 7 days.    Dispense:  14 tablet    Refill:  0   fluconazole (DIFLUCAN) 150 MG tablet    Sig: Take 1 tablet (150 mg total) by mouth once for 1 dose.    Dispense:  1 tablet    Refill:  0    

## 2020-04-20 ENCOUNTER — Other Ambulatory Visit: Payer: Self-pay

## 2020-04-20 LAB — CULTURE, BETA STREP (GROUP B ONLY): Strep Gp B Culture: NEGATIVE

## 2020-04-20 NOTE — Progress Notes (Signed)
error 

## 2020-04-21 ENCOUNTER — Encounter (HOSPITAL_COMMUNITY): Payer: Self-pay | Admitting: *Deleted

## 2020-04-21 ENCOUNTER — Telehealth (HOSPITAL_COMMUNITY): Payer: Self-pay | Admitting: *Deleted

## 2020-04-21 NOTE — Telephone Encounter (Signed)
Preadmission screen  

## 2020-04-22 ENCOUNTER — Ambulatory Visit (INDEPENDENT_AMBULATORY_CARE_PROVIDER_SITE_OTHER): Payer: Medicaid Other | Admitting: Obstetrics and Gynecology

## 2020-04-22 ENCOUNTER — Encounter: Payer: Self-pay | Admitting: Obstetrics and Gynecology

## 2020-04-22 ENCOUNTER — Other Ambulatory Visit: Payer: Self-pay

## 2020-04-22 VITALS — BP 102/69 | HR 94 | Wt 124.0 lb

## 2020-04-22 DIAGNOSIS — O4400 Placenta previa specified as without hemorrhage, unspecified trimester: Secondary | ICD-10-CM

## 2020-04-22 DIAGNOSIS — O365930 Maternal care for other known or suspected poor fetal growth, third trimester, not applicable or unspecified: Secondary | ICD-10-CM

## 2020-04-22 DIAGNOSIS — O99012 Anemia complicating pregnancy, second trimester: Secondary | ICD-10-CM

## 2020-04-22 DIAGNOSIS — Z3480 Encounter for supervision of other normal pregnancy, unspecified trimester: Secondary | ICD-10-CM

## 2020-04-22 DIAGNOSIS — O365990 Maternal care for other known or suspected poor fetal growth, unspecified trimester, not applicable or unspecified: Secondary | ICD-10-CM | POA: Insufficient documentation

## 2020-04-22 HISTORY — DX: Maternal care for other known or suspected poor fetal growth, unspecified trimester, not applicable or unspecified: O36.5990

## 2020-04-22 NOTE — Progress Notes (Signed)
   PRENATAL VISIT NOTE  Subjective:  Kevan Deniston is a 24 y.o. G3P1011 at [redacted]w[redacted]d being seen today for ongoing prenatal care.  She is currently monitored for the following issues for this high-risk pregnancy and has Supervision of other normal pregnancy, antepartum; Mildly underweight adult; Placenta previa antepartum; Anemia during pregnancy in second trimester; and IUGR (intrauterine growth restriction) affecting care of mother on their problem list.  Patient reports no complaints.  Contractions: Irregular. Vag. Bleeding: None.  Movement: Present. Denies leaking of fluid.   The following portions of the patient's history were reviewed and updated as appropriate: allergies, current medications, past family history, past medical history, past social history, past surgical history and problem list.   Objective:   Vitals:   04/22/20 1357  BP: 102/69  Pulse: 94  Weight: 124 lb (56.2 kg)    Fetal Status: Fetal Heart Rate (bpm): 159   Movement: Present     General:  Alert, oriented and cooperative. Patient is in no acute distress.  Skin: Skin is warm and dry. No rash noted.   Cardiovascular: Normal heart rate noted  Respiratory: Normal respiratory effort, no problems with respiration noted  Abdomen: Soft, gravid, appropriate for gestational age.  Pain/Pressure: Present     Pelvic: Cervical exam deferred        Extremities: Normal range of motion.  Edema: None  Mental Status: Normal mood and affect. Normal behavior. Normal judgment and thought content.   Assessment and Plan:  Pregnancy: G3P1011 at [redacted]w[redacted]d  1. Supervision of other normal pregnancy, antepartum  2. Placenta previa antepartum Resolved  3. Poor fetal growth affecting management of mother in third trimester, single or unspecified fetus Being followed by MFM For delivery at 51 weeks, already schduled  4. Anemia during pregnancy in second trimester Scheduled for iron transfusion for 04/24/20   Preterm labor symptoms and  general obstetric precautions including but not limited to vaginal bleeding, contractions, leaking of fluid and fetal movement were reviewed in detail with the patient. Please refer to After Visit Summary for other counseling recommendations.   Return in about 1 week (around 04/29/2020) for high OB, in person.  Future Appointments  Date Time Provider [place]  04/23/2020  8:00 AM [facility] US1 [facility]  04/24/2020 12:00 PM [facility] ROOM 2 [facility] None  04/28/2020  9:45 AM [facility]-SCREENING [facility] None  04/30/2020  7:20 AM [facility]-LD SCHED ROOM [facility] None  04/30/2020  8:00 AM [facility] US1 [facility]  05/07/2020  8:00 AM [facility] US1 [facility]    Sloan Leiter, MD

## 2020-04-23 ENCOUNTER — Ambulatory Visit (HOSPITAL_COMMUNITY): Payer: Medicaid Other | Attending: Maternal & Fetal Medicine

## 2020-04-23 ENCOUNTER — Ambulatory Visit: Payer: Medicaid Other | Admitting: *Deleted

## 2020-04-23 ENCOUNTER — Encounter: Payer: Self-pay | Admitting: *Deleted

## 2020-04-23 DIAGNOSIS — Z3A37 37 weeks gestation of pregnancy: Secondary | ICD-10-CM | POA: Diagnosis not present

## 2020-04-23 DIAGNOSIS — O4443 Low lying placenta NOS or without hemorrhage, third trimester: Secondary | ICD-10-CM | POA: Diagnosis not present

## 2020-04-23 DIAGNOSIS — D649 Anemia, unspecified: Secondary | ICD-10-CM | POA: Diagnosis not present

## 2020-04-23 DIAGNOSIS — O365930 Maternal care for other known or suspected poor fetal growth, third trimester, not applicable or unspecified: Secondary | ICD-10-CM | POA: Insufficient documentation

## 2020-04-23 DIAGNOSIS — O99013 Anemia complicating pregnancy, third trimester: Secondary | ICD-10-CM

## 2020-04-24 ENCOUNTER — Other Ambulatory Visit: Payer: Self-pay | Admitting: Advanced Practice Midwife

## 2020-04-24 ENCOUNTER — Other Ambulatory Visit: Payer: Self-pay | Admitting: Obstetrics and Gynecology

## 2020-04-24 ENCOUNTER — Other Ambulatory Visit: Payer: Self-pay

## 2020-04-24 ENCOUNTER — Ambulatory Visit (HOSPITAL_COMMUNITY)
Admission: RE | Admit: 2020-04-24 | Discharge: 2020-04-24 | Disposition: A | Discharge status: Home / Self Care | Payer: Medicaid Other | Source: Ambulatory Visit | Attending: Obstetrics & Gynecology | Admitting: Obstetrics & Gynecology

## 2020-04-24 DIAGNOSIS — O99013 Anemia complicating pregnancy, third trimester: Secondary | ICD-10-CM | POA: Insufficient documentation

## 2020-04-24 DIAGNOSIS — D649 Anemia, unspecified: Secondary | ICD-10-CM | POA: Diagnosis not present

## 2020-04-24 DIAGNOSIS — Z3A00 Weeks of gestation of pregnancy not specified: Secondary | ICD-10-CM | POA: Insufficient documentation

## 2020-04-24 MED ORDER — SODIUM CHLORIDE 0.9 % IV SOLN
510.0000 mg | INTRAVENOUS | Status: DC
  Administered 2020-04-24: 510 mg via INTRAVENOUS
  Filled 2020-04-24: qty 17

## 2020-04-24 NOTE — Progress Notes (Signed)
feraheme order placed.

## 2020-04-24 NOTE — Discharge Instructions (Signed)

## 2020-04-28 ENCOUNTER — Other Ambulatory Visit (HOSPITAL_COMMUNITY)
Admission: RE | Admit: 2020-04-28 | Discharge: 2020-04-28 | Disposition: A | Discharge status: Home / Self Care | Payer: Medicaid Other | Source: Ambulatory Visit | Attending: Family Medicine | Admitting: Family Medicine

## 2020-04-28 DIAGNOSIS — Z20822 Contact with and (suspected) exposure to covid-19: Secondary | ICD-10-CM | POA: Insufficient documentation

## 2020-04-28 DIAGNOSIS — Z01812 Encounter for preprocedural laboratory examination: Secondary | ICD-10-CM | POA: Diagnosis not present

## 2020-04-28 LAB — SARS CORONAVIRUS 2 (TAT 6-24 HRS): SARS Coronavirus 2: NEGATIVE

## 2020-04-29 ENCOUNTER — Other Ambulatory Visit: Payer: Self-pay

## 2020-04-29 ENCOUNTER — Ambulatory Visit (INDEPENDENT_AMBULATORY_CARE_PROVIDER_SITE_OTHER): Payer: Medicaid Other | Admitting: Obstetrics and Gynecology

## 2020-04-29 ENCOUNTER — Encounter: Payer: Self-pay | Admitting: Obstetrics and Gynecology

## 2020-04-29 VITALS — BP 102/67 | HR 85 | Wt 124.7 lb

## 2020-04-29 DIAGNOSIS — O99012 Anemia complicating pregnancy, second trimester: Secondary | ICD-10-CM

## 2020-04-29 DIAGNOSIS — O365930 Maternal care for other known or suspected poor fetal growth, third trimester, not applicable or unspecified: Secondary | ICD-10-CM

## 2020-04-29 DIAGNOSIS — Z3480 Encounter for supervision of other normal pregnancy, unspecified trimester: Secondary | ICD-10-CM

## 2020-04-29 NOTE — Progress Notes (Signed)
   PRENATAL VISIT NOTE  Subjective:  Roberta Alexander is a 24 y.o. G3P1011 at [redacted]w[redacted]d being seen today for ongoing prenatal care.  She is currently monitored for the following issues for this high-risk pregnancy and has Supervision of other normal pregnancy, antepartum; Mildly underweight adult; Placenta previa antepartum; Anemia during pregnancy in second trimester; and IUGR (intrauterine growth restriction) affecting care of mother on their problem list.  Patient reports occasional contractions.  Contractions: Irritability. Vag. Bleeding: None.  Movement: Present. Denies leaking of fluid.   The following portions of the patient's history were reviewed and updated as appropriate: allergies, current medications, past family history, past medical history, past social history, past surgical history and problem list.   Objective:   Vitals:   04/29/20 1531  BP: 102/67  Pulse: 85  Weight: 124 lb 11.2 oz (56.6 kg)    Fetal Status: Fetal Heart Rate (bpm): 154   Movement: Present     General:  Alert, oriented and cooperative. Patient is in no acute distress.  Skin: Skin is warm and dry. No rash noted.   Cardiovascular: Normal heart rate noted  Respiratory: Normal respiratory effort, no problems with respiration noted  Abdomen: Soft, gravid, appropriate for gestational age.  Pain/Pressure: Absent     Pelvic: Cervical exam deferred        Extremities: Normal range of motion.  Edema: None  Mental Status: Normal mood and affect. Normal behavior. Normal judgment and thought content.   Assessment and Plan:  Pregnancy: G3P1011 at [redacted]w[redacted]d  1. Supervision of other normal pregnancy, antepartum  2. Poor fetal growth affecting management of mother in third trimester, single or unspecified fetus Has IOL scheduled for tomorrow  3. Anemia during pregnancy in second trimester S/p 1 x feraheme   Term labor symptoms and general obstetric precautions including but not limited to vaginal bleeding,  contractions, leaking of fluid and fetal movement were reviewed in detail with the patient. Please refer to After Visit Summary for other counseling recommendations.   Return in about 4 weeks (around 05/27/2020) for post partum check.  Future Appointments  Date Time Provider [place]  04/30/2020  7:20 AM [facility]-LD SCHED ROOM [facility] None  05/01/2020 12:00 PM [facility] ROOM 3 [facility] None    Sloan Leiter, MD

## 2020-04-29 NOTE — Progress Notes (Signed)
Pt is here for ROB [redacted]w[redacted]d.

## 2020-04-30 ENCOUNTER — Inpatient Hospital Stay (HOSPITAL_COMMUNITY): Admission: AD | Admit: 2020-04-30 | Payer: Medicaid Other | Source: Home / Self Care | Admitting: Family Medicine

## 2020-04-30 ENCOUNTER — Encounter (HOSPITAL_COMMUNITY): Payer: Self-pay | Admitting: Family Medicine

## 2020-04-30 ENCOUNTER — Ambulatory Visit: Payer: Medicaid Other

## 2020-04-30 ENCOUNTER — Inpatient Hospital Stay (HOSPITAL_COMMUNITY)
Admission: AD | Admit: 2020-04-30 | Discharge: 2020-05-02 | DRG: 807 | Disposition: A | Discharge status: Home / Self Care | Payer: Medicaid Other | Attending: Obstetrics & Gynecology | Admitting: Obstetrics & Gynecology

## 2020-04-30 ENCOUNTER — Inpatient Hospital Stay (HOSPITAL_COMMUNITY): Payer: Medicaid Other | Admitting: Anesthesiology

## 2020-04-30 ENCOUNTER — Other Ambulatory Visit: Payer: Self-pay

## 2020-04-30 ENCOUNTER — Inpatient Hospital Stay (HOSPITAL_COMMUNITY): Payer: Medicaid Other

## 2020-04-30 ENCOUNTER — Other Ambulatory Visit: Payer: Self-pay | Admitting: Advanced Practice Midwife

## 2020-04-30 DIAGNOSIS — O99012 Anemia complicating pregnancy, second trimester: Secondary | ICD-10-CM | POA: Diagnosis present

## 2020-04-30 DIAGNOSIS — Z30017 Encounter for initial prescription of implantable subdermal contraceptive: Secondary | ICD-10-CM | POA: Diagnosis not present

## 2020-04-30 DIAGNOSIS — O9902 Anemia complicating childbirth: Secondary | ICD-10-CM | POA: Diagnosis present

## 2020-04-30 DIAGNOSIS — Z87891 Personal history of nicotine dependence: Secondary | ICD-10-CM

## 2020-04-30 DIAGNOSIS — Z3A38 38 weeks gestation of pregnancy: Secondary | ICD-10-CM

## 2020-04-30 DIAGNOSIS — Z3480 Encounter for supervision of other normal pregnancy, unspecified trimester: Secondary | ICD-10-CM

## 2020-04-30 DIAGNOSIS — O365930 Maternal care for other known or suspected poor fetal growth, third trimester, not applicable or unspecified: Secondary | ICD-10-CM | POA: Diagnosis present

## 2020-04-30 DIAGNOSIS — R636 Underweight: Secondary | ICD-10-CM | POA: Diagnosis present

## 2020-04-30 DIAGNOSIS — O365990 Maternal care for other known or suspected poor fetal growth, unspecified trimester, not applicable or unspecified: Secondary | ICD-10-CM | POA: Diagnosis present

## 2020-04-30 LAB — CBC
HCT: 31.6 % — ABNORMAL LOW (ref 36.0–46.0)
Hemoglobin: 9.2 g/dL — ABNORMAL LOW (ref 12.0–15.0)
MCH: 25.1 pg — ABNORMAL LOW (ref 26.0–34.0)
MCHC: 29.1 g/dL — ABNORMAL LOW (ref 30.0–36.0)
MCV: 86.3 fL (ref 80.0–100.0)
Platelets: 232 10*3/uL (ref 150–400)
RBC: 3.66 MIL/uL — ABNORMAL LOW (ref 3.87–5.11)
RDW: 21.1 % — ABNORMAL HIGH (ref 11.5–15.5)
WBC: 10.4 10*3/uL (ref 4.0–10.5)
nRBC: 0.8 % — ABNORMAL HIGH (ref 0.0–0.2)

## 2020-04-30 LAB — RPR: RPR Ser Ql: NONREACTIVE

## 2020-04-30 LAB — TYPE AND SCREEN
ABO/RH(D): B POS
Antibody Screen: NEGATIVE

## 2020-04-30 MED ORDER — DIPHENHYDRAMINE HCL 25 MG PO CAPS: 25.0000 mg | ORAL_CAPSULE | Freq: Four times a day (QID) | ORAL | Status: DC | PRN

## 2020-04-30 MED ORDER — ONDANSETRON HCL 4 MG/2ML IJ SOLN: 4.0000 mg | INTRAMUSCULAR | Status: DC | PRN

## 2020-04-30 MED ORDER — WITCH HAZEL-GLYCERIN EX PADS: 1.0000 "application " | MEDICATED_PAD | CUTANEOUS | Status: DC | PRN

## 2020-04-30 MED ORDER — ZOLPIDEM TARTRATE 5 MG PO TABS: 5.0000 mg | ORAL_TABLET | Freq: Every evening | ORAL | Status: DC | PRN

## 2020-04-30 MED ORDER — LACTATED RINGERS AMNIOINFUSION: INTRAVENOUS | Status: DC

## 2020-04-30 MED ORDER — ACETAMINOPHEN 325 MG PO TABS: 650.0000 mg | ORAL_TABLET | ORAL | Status: DC | PRN

## 2020-04-30 MED ORDER — EPHEDRINE 5 MG/ML INJ: 10.0000 mg | INTRAVENOUS | Status: DC | PRN

## 2020-04-30 MED ORDER — IBUPROFEN 600 MG PO TABS
600.0000 mg | ORAL_TABLET | Freq: Three times a day (TID) | ORAL | Status: DC | PRN
  Filled 2020-04-30: qty 1

## 2020-04-30 MED ORDER — PHENYLEPHRINE 40 MCG/ML (10ML) SYRINGE FOR IV PUSH (FOR BLOOD PRESSURE SUPPORT): 80.0000 ug | PREFILLED_SYRINGE | INTRAVENOUS | Status: DC | PRN

## 2020-04-30 MED ORDER — OXYCODONE-ACETAMINOPHEN 5-325 MG PO TABS: 1.0000 | ORAL_TABLET | ORAL | Status: DC | PRN

## 2020-04-30 MED ORDER — MEASLES, MUMPS & RUBELLA VAC IJ SOLR: 0.5000 mL | Freq: Once | INTRAMUSCULAR | Status: DC

## 2020-04-30 MED ORDER — TERBUTALINE SULFATE 1 MG/ML IJ SOLN: 0.2500 mg | Freq: Once | INTRAMUSCULAR | Status: DC | PRN

## 2020-04-30 MED ORDER — FENTANYL-BUPIVACAINE-NACL 0.5-0.125-0.9 MG/250ML-% EP SOLN
12.0000 mL/h | EPIDURAL | Status: DC | PRN
  Filled 2020-04-30: qty 250

## 2020-04-30 MED ORDER — DIPHENHYDRAMINE HCL 50 MG/ML IJ SOLN: 12.5000 mg | INTRAMUSCULAR | Status: DC | PRN

## 2020-04-30 MED ORDER — SODIUM CHLORIDE (PF) 0.9 % IJ SOLN
INTRAMUSCULAR | Status: DC | PRN
  Administered 2020-04-30: 12 mL/h via EPIDURAL

## 2020-04-30 MED ORDER — LACTATED RINGERS IV SOLN: 500.0000 mL | Freq: Once | INTRAVENOUS | Status: DC

## 2020-04-30 MED ORDER — COCONUT OIL OIL: 1.0000 "application " | TOPICAL_OIL | Status: DC | PRN

## 2020-04-30 MED ORDER — ONDANSETRON HCL 4 MG/2ML IJ SOLN: 4.0000 mg | Freq: Four times a day (QID) | INTRAMUSCULAR | Status: DC | PRN

## 2020-04-30 MED ORDER — PRENATAL MULTIVITAMIN CH
1.0000 | ORAL_TABLET | Freq: Every day | ORAL | Status: DC
  Filled 2020-04-30: qty 1

## 2020-04-30 MED ORDER — TETANUS-DIPHTH-ACELL PERTUSSIS 5-2.5-18.5 LF-MCG/0.5 IM SUSP: 0.5000 mL | Freq: Once | INTRAMUSCULAR | Status: DC

## 2020-04-30 MED ORDER — OXYCODONE-ACETAMINOPHEN 5-325 MG PO TABS: 2.0000 | ORAL_TABLET | ORAL | Status: DC | PRN

## 2020-04-30 MED ORDER — OXYTOCIN 40 UNITS IN NORMAL SALINE INFUSION - SIMPLE MED
2.5000 [IU]/h | INTRAVENOUS | Status: DC
  Filled 2020-04-30: qty 1000

## 2020-04-30 MED ORDER — ACETAMINOPHEN 325 MG PO TABS
650.0000 mg | ORAL_TABLET | Freq: Four times a day (QID) | ORAL | Status: DC | PRN
  Administered 2020-04-30 – 2020-05-02 (×6): 650 mg via ORAL
  Filled 2020-04-30 (×6): qty 2

## 2020-04-30 MED ORDER — ONDANSETRON HCL 4 MG PO TABS: 4.0000 mg | ORAL_TABLET | ORAL | Status: DC | PRN

## 2020-04-30 MED ORDER — LIDOCAINE HCL (PF) 1 % IJ SOLN: 30.0000 mL | INTRAMUSCULAR | Status: DC | PRN

## 2020-04-30 MED ORDER — BENZOCAINE-MENTHOL 20-0.5 % EX AERO
1.0000 "application " | INHALATION_SPRAY | CUTANEOUS | Status: DC | PRN
  Administered 2020-05-02: 1 via TOPICAL
  Filled 2020-04-30: qty 56

## 2020-04-30 MED ORDER — FERROUS SULFATE 325 (65 FE) MG PO TABS
325.0000 mg | ORAL_TABLET | Freq: Every day | ORAL | Status: DC
  Administered 2020-05-01: 325 mg via ORAL
  Filled 2020-04-30: qty 1

## 2020-04-30 MED ORDER — SENNOSIDES-DOCUSATE SODIUM 8.6-50 MG PO TABS
2.0000 | ORAL_TABLET | ORAL | Status: DC
  Administered 2020-04-30 – 2020-05-01 (×2): 2 via ORAL
  Filled 2020-04-30 (×2): qty 2

## 2020-04-30 MED ORDER — OXYTOCIN 40 UNITS IN NORMAL SALINE INFUSION - SIMPLE MED
1.0000 m[IU]/min | INTRAVENOUS | Status: DC
  Administered 2020-04-30: 2 m[IU]/min via INTRAVENOUS

## 2020-04-30 MED ORDER — LACTATED RINGERS IV SOLN: 500.0000 mL | INTRAVENOUS | Status: DC | PRN

## 2020-04-30 MED ORDER — DIBUCAINE (PERIANAL) 1 % EX OINT: 1.0000 "application " | TOPICAL_OINTMENT | CUTANEOUS | Status: DC | PRN

## 2020-04-30 MED ORDER — LACTATED RINGERS IV SOLN: INTRAVENOUS | Status: DC

## 2020-04-30 MED ORDER — SOD CITRATE-CITRIC ACID 500-334 MG/5ML PO SOLN: 30.0000 mL | ORAL | Status: DC | PRN

## 2020-04-30 MED ORDER — SIMETHICONE 80 MG PO CHEW: 80.0000 mg | CHEWABLE_TABLET | ORAL | Status: DC | PRN

## 2020-04-30 MED ORDER — LIDOCAINE HCL (PF) 1 % IJ SOLN
INTRAMUSCULAR | Status: DC | PRN
  Administered 2020-04-30: 11 mL via EPIDURAL

## 2020-04-30 MED ORDER — OXYTOCIN BOLUS FROM INFUSION
500.0000 mL | Freq: Once | INTRAVENOUS | Status: AC
  Administered 2020-04-30: 500 mL via INTRAVENOUS

## 2020-04-30 NOTE — Anesthesia Preprocedure Evaluation (Signed)
Anesthesia Evaluation  Patient identified by MRN, date of birth, ID band Patient awake    Reviewed: Allergy & Precautions, NPO status , Patient's Chart, lab work & pertinent test results  Airway Mallampati: II  TM Distance: >3 FB Neck ROM: Full    Dental no notable dental hx.    Pulmonary neg pulmonary ROS, former smoker,    Pulmonary exam normal breath sounds clear to auscultation       Cardiovascular negative cardio ROS Normal cardiovascular exam Rhythm:Regular Rate:Normal     Neuro/Psych negative neurological ROS  negative psych ROS   GI/Hepatic negative GI ROS, Neg liver ROS,   Endo/Other  negative endocrine ROS  Renal/GU negative Renal ROS  negative genitourinary   Musculoskeletal negative musculoskeletal ROS (+)   Abdominal   Peds negative pediatric ROS (+)  Hematology negative hematology ROS (+)   Anesthesia Other Findings   Reproductive/Obstetrics (+) Pregnancy                             Anesthesia Physical Anesthesia Plan  ASA: II  Anesthesia Plan: Epidural   Post-op Pain Management:    Induction:   PONV Risk Score and Plan:   Airway Management Planned:   Additional Equipment:   Intra-op Plan:   Post-operative Plan:   Informed Consent:   Plan Discussed with:   Anesthesia Plan Comments:         Anesthesia Quick Evaluation

## 2020-04-30 NOTE — H&P (Signed)
Roberta Alexander is a 24 y.o. female G3P1011 at [redacted]w[redacted]d presenting for IOL due to fetal growth restriction.  OB History    Gravida  3   Para  1   Term  1   Preterm      AB  1   Living  1     SAB      TAB      Ectopic      Multiple      Live Births  1          Past Medical History:  Diagnosis Date  . Anemia    Past Surgical History:  Procedure Laterality Date  . INDUCED ABORTION     Family History: family history is not on file. Social History:  reports that she quit smoking about 16 months ago. Her smoking use included cigarettes. She has never used smokeless tobacco. She reports previous alcohol use. She reports previous drug use.     Maternal Diabetes: No Genetic Screening: Normal Maternal Ultrasounds/Referrals: IUGR Fetal Ultrasounds or other Referrals:  Referred to Materal Fetal Medicine  Maternal Substance Abuse:  No Significant Maternal Medications:  Meds include: Other:  Significant Maternal Lab Results:  Group B Strep negative Other Comments:  Anemic  Review of Systems History Dilation: 3 Effacement (%): 70 Station: -2 Exam by:: Mumaw Blood pressure 102/67, pulse 80, temperature 98.2 F (36.8 C), temperature source Oral, resp. rate 18, height 5\' 5"  (1.651 m), weight 56.7 kg, last menstrual period 08/01/2019, unknown if currently breastfeeding. Exam Physical Exam  Constitutional: She is oriented to person, place, and time. She appears well-developed. No distress.  HENT:  Head: Normocephalic and atraumatic.  Cardiovascular: Normal rate.  Respiratory: Effort normal.  GI: There is no abdominal tenderness. There is no guarding.  Gravid  Neurological: She is alert and oriented to person, place, and time.    Prenatal labs: ABO, Rh: --/--/B POS (05/20 [id number]) Antibody: NEG (05/20 0835) Rubella: 2.69 (12/01 1021) RPR: NON REACTIVE (05/20 0828)  HBsAg: Negative (12/01 1021)  HIV: Non Reactive (05/06 1132)  GBS: Negative/-- (05/06 1118)    Assessment/Plan: Roberta Alexander is a 24 y.o. G3P1011 at [redacted]w[redacted]d   #Labor:  IOL with pitocin #Pain: May have epidural if she desires. #FWB:  Cat I #ID:  GBS Neg  #MOF: Both #MOC: Vance, DO 04/30/2020, 10:44 AM

## 2020-04-30 NOTE — Progress Notes (Signed)
Roberta Alexander is a 24 y.o. G3P1011 at [redacted]w[redacted]d by LMP admitted for induction of labor due to Poor fetal growth.  Subjective: Patient is on pitocin, contracting every 2 min, patient states she does not feel these, she is comfortable laying in bed.  Objective: BP 109/72   Pulse 79   Temp 98.1 F (36.7 C) (Oral)   Resp 18   Ht 5\' 5"  (1.651 m)   Wt 56.7 kg   LMP 08/01/2019 (Approximate)   BMI 20.82 kg/m  No intake/output data recorded. No intake/output data recorded.  FHT:  FHR: 135 bpm, variability: moderate,  accelerations:  Present,  decelerations:  Absent UC:   regular, every 2 minutes SVE:   Dilation: 3 Effacement (%): 70 Station: -2 Exam by:: Mumaw  Labs: Lab Results  Component Value Date   WBC 10.4 04/30/2020   HGB 9.2 (L) 04/30/2020   HCT 31.6 (L) 04/30/2020   MCV 86.3 04/30/2020   PLT 232 04/30/2020    Assessment / Plan: Induction of labor due to IUGR,  progressing well on pitocin  Labor: Progressing on Pitocin, will continue to increase then AROM Preeclampsia:  N/A Fetal Wellbeing:  Category I Pain Control:  Nothing for now, epidural OK upon maternal request I/D:  GBS Neg Anticipated MOD:  NSVD  Katherine Basset, DO 04/30/2020, 12:36 PM

## 2020-04-30 NOTE — Anesthesia Procedure Notes (Signed)
Epidural Patient location during procedure: OB Start time: 04/30/2020 4:26 PM End time: 04/30/2020 4:39 PM  Staffing Anesthesiologist: Lynda Rainwater, MD Performed: anesthesiologist   Preanesthetic Checklist Completed: patient identified, IV checked, site marked, risks and benefits discussed, surgical consent, monitors and equipment checked, pre-op evaluation and timeout performed  Epidural Patient position: sitting Prep: ChloraPrep Patient monitoring: heart rate, cardiac monitor, continuous pulse ox and blood pressure Approach: midline Location: L2-L3 Injection technique: LOR saline  Needle:  Needle type: Tuohy  Needle gauge: 17 G Needle length: 9 cm Needle insertion depth: 5 cm Catheter type: closed end flexible Catheter size: 20 Guage Catheter at skin depth: 9 cm Test dose: negative  Assessment Events: blood not aspirated, injection not painful, no injection resistance, no paresthesia and negative IV test  Additional Notes Reason for block:procedure for pain

## 2020-04-30 NOTE — Progress Notes (Signed)
Roberta Alexander is a 24 y.o. G3P1011 at [redacted]w[redacted]d by LMP admitted for induction of labor due to Poor fetal growth.  Subjective: Patient comfortable with epidural now. Rechecked patient after epidural placed. Consent for IUPC placement from patient obtained, discussed amnioinfusion.  Objective: BP 121/81   Pulse 76   Temp 98.1 F (36.7 C) (Oral)   Resp 18   Ht 5\' 5"  (1.651 m)   Wt 56.7 kg   LMP 08/01/2019 (Approximate)   SpO2 99%   BMI 20.82 kg/m  No intake/output data recorded. Total I/O In: 542.8 [I.V.:542.8] Out: -   FHT:  FHR: 135 bpm, variability: moderate,  accelerations:  Abscent,  decelerations:  Present repetitive variable decels with contractions, returns to baseline UC:   regular, every 2-3 minutes SVE:   Dilation: 4 Effacement (%): 90 Station: 0 Exam by:: Halbert Jesson   IUPC placed with ease, patient tolerated procedure well, amnioinfusion to be started.  Labs: Lab Results  Component Value Date   WBC 10.4 04/30/2020   HGB 9.2 (L) 04/30/2020   HCT 31.6 (L) 04/30/2020   MCV 86.3 04/30/2020   PLT 232 04/30/2020    Assessment / Plan: Induction of labor due to IUGR,  progressing well on pitocin    Labor: Progressing normally, on pitocin, AROM at 16:00, IUPC placed at 16:45, amnioinfusion to be started. Pitocin was cut in half to 95milliunits.   Preeclampsia:  N/A Fetal Wellbeing:  Category II Pain Control:  Currently undergoing epidural placement I/D:  GBS Neg Anticipated MOD:  NSVD  Katherine Basset, DO 04/30/2020, 4:54 PM

## 2020-04-30 NOTE — Progress Notes (Signed)
Roberta Alexander is a 24 y.o. G3P1011 at [redacted]w[redacted]d by LMP admitted for induction of labor due to Poor fetal growth.  Subjective: Patient is comfortable in room, feeling contractions more but wants to go without epidural. Discussed risks/benefits of AROM, patient amenable to have done.  Objective: BP 120/81   Pulse 75   Temp 98.1 F (36.7 C) (Oral)   Resp 18   Ht 5\' 5"  (1.651 m)   Wt 56.7 kg   LMP 08/01/2019 (Approximate)   BMI 20.82 kg/m  No intake/output data recorded. Total I/O In: 542.8 [I.V.:542.8] Out: -   FHT:  FHR: 135 bpm, variability: moderate,  accelerations:  Present,  decelerations:  Absent UC:   regular, every 2-3 minutes SVE:   Dilation: 4 Effacement (%): 80 Station: -2 Exam by:: Zafiro Routson  AROM performed, large amount of clear amniotic fluid returned. Patient tolerated procedure well.  Labs: Lab Results  Component Value Date   WBC 10.4 04/30/2020   HGB 9.2 (L) 04/30/2020   HCT 31.6 (L) 04/30/2020   MCV 86.3 04/30/2020   PLT 232 04/30/2020    Assessment / Plan: Induction of labor due to IUGR,  progressing well on pitocin, AROM performed  Labor: Progressing normally, on pitocin, AROM performed Preeclampsia:  N/A Fetal Wellbeing:  Category I Pain Control:  Nothing for now, epidural OK upon maternal request I/D:  GBS Neg Anticipated MOD:  NSVD  Katherine Basset, DO 04/30/2020, 4:09 PM

## 2020-04-30 NOTE — Progress Notes (Signed)
Roberta Alexander is a 24 y.o. G3P1011 at [redacted]w[redacted]d by LMP admitted for induction of labor due to Poor fetal growth.  Subjective: Patient being prepped for epidural, after AROM began having repetitive variable decels with contractions, with return to baseline. Patient having painful contractions now.  Objective: BP 120/81   Pulse 75   Temp 98.1 F (36.7 C) (Oral)   Resp 18   Ht 5\' 5"  (1.651 m)   Wt 56.7 kg   LMP 08/01/2019 (Approximate)   BMI 20.82 kg/m  No intake/output data recorded. Total I/O In: 542.8 [I.V.:542.8] Out: -   FHT:  FHR: 135 bpm, variability: moderate,  accelerations:  Present,  decelerations:  Present repetitive variable decels with contractions, returns to baseline UC:   regular, every 2-3 minutes SVE:   Dilation: 4 Effacement (%): 80 Station: -2 Exam by:: Mumaw   Labs: Lab Results  Component Value Date   WBC 10.4 04/30/2020   HGB 9.2 (L) 04/30/2020   HCT 31.6 (L) 04/30/2020   MCV 86.3 04/30/2020   PLT 232 04/30/2020    Assessment / Plan: Induction of labor due to IUGR,  progressing well on pitocin    Labor: Progressing normally, on pitocin, AROM at 16:00. Pitocin cut in half while epidural being placed, will evaluate immediately after epidural is placed.  Preeclampsia:  N/A Fetal Wellbeing:  Category II Pain Control:  Currently undergoing epidural placement I/D:  GBS Neg Anticipated MOD:  NSVD  Katherine Basset, DO 04/30/2020, 4:33 PM

## 2020-04-30 NOTE — Discharge Summary (Signed)
Postpartum Discharge Summary   Patient Name: Roberta Alexander DOB: [birth year] MRN: [id number]  Date of admission: 04/30/2020 Delivery date:04/30/2020  Delivering provider: Katherine Basset [facility]  Date of discharge: 05/02/2020  Admitting diagnosis: IUGR (intrauterine growth restriction) affecting care of mother [O36.5990] Intrauterine pregnancy: [redacted]w[redacted]d    Secondary diagnosis:  Principal Problem:   IUGR (intrauterine growth restriction) affecting care of mother Active Problems:   Supervision of other normal pregnancy, antepartum   Mildly underweight adult   Anemia during pregnancy in second trimester  Additional problems: NONE    Discharge diagnosis: Term Pregnancy Delivered                                              Post partum procedures:None Augmentation: AROM and Pitocin Complications: None  Hospital course: Induction of Labor With Vaginal Delivery   24y.o. yo G3P1011 at 370w0das admitted to the hospital 04/30/2020 for induction of labor.  Indication for induction: Fetal growth restriction.  Patient had an uncomplicated labor course as follows: Membrane Rupture Time/Date: 4:04 PM ,04/30/2020   Delivery Method:Vaginal, Spontaneous  Episiotomy: None  Lacerations:  Periurethral  Details of delivery can be found in separate delivery note.  Patient had a routine postpartum course. Patient is discharged home 05/02/20.  Newborn Data: Birth date:04/30/2020  Birth time:5:18 PM  Gender:Female  Living status:Living  Apgars:8 ,9  Weight:2545 g   Magnesium Sulfate received: No BMZ received: No Rhophylac:N/A MMR:N/A T-DaP:Given prenatally Flu: N/A Transfusion:No  Physical exam  Vitals:   05/01/20 0919 05/01/20 1421 05/01/20 2227 05/02/20 0519  BP: 112/79 111/79 107/72 100/70  Pulse: 71 71 75 70  Resp: '18 16 18 19  ' Temp: 98.1 F (36.7 C) 98.2 F (36.8 C) 98.1 F (36.7 C) 98.2 F (36.8 C)  TempSrc: Oral Oral Oral Oral  SpO2: 100%   98%  Weight:      Height:        General: alert, cooperative and no distress Lochia: appropriate Uterine Fundus: firm Incision: N/A DVT Evaluation: No evidence of DVT seen on physical exam. Negative Homan's sign. No cords or calf tenderness. No significant calf/ankle edema. Labs: Lab Results  Component Value Date   WBC 10.4 04/30/2020   HGB 9.2 (L) 04/30/2020   HCT 31.6 (L) 04/30/2020   MCV 86.3 04/30/2020   PLT 232 04/30/2020   CMP Latest Ref Rng & Units 03/18/2020  Glucose 70 - 99 mg/dL 81  BUN 6 - 20 mg/dL 7  Creatinine 0.44 - 1.00 mg/dL 0.57  Sodium 135 - 145 mmol/L 133(L)  Potassium 3.5 - 5.1 mmol/L 3.4(L)  Chloride 98 - 111 mmol/L 103  CO2 22 - 32 mmol/L 24  Calcium 8.9 - 10.3 mg/dL 8.6(L)  Total Protein 6.5 - 8.1 g/dL 6.7  Total Bilirubin 0.3 - 1.2 mg/dL 0.4  Alkaline Phos 38 - 126 U/L 99  AST 15 - 41 U/L 21  ALT 0 - 44 U/L 10   Edinburgh Score: Edinburgh Postnatal Depression Scale Screening Tool 05/02/2020  I have been able to laugh and see the funny side of things. 1  I have looked forward with enjoyment to things. 0  I have blamed myself unnecessarily when things went wrong. 2  I have been anxious or worried for no good reason. 2  I have felt scared or panicky for no good reason. 0  Things  have been getting on top of me. 2  I have been so unhappy that I have had difficulty sleeping. 1  I have felt sad or miserable. 2  I have been so unhappy that I have been crying. 1  The thought of harming myself has occurred to me. 0  Edinburgh Postnatal Depression Scale Total 11     After visit meds:  Allergies as of 05/02/2020   No Known Allergies     Medication List    STOP taking these medications   docusate sodium 100 MG capsule Commonly known as: Colace     TAKE these medications   Coker 1 Units by Does not apply route as needed (Remove at bedtime).   [facility] Prenatal Gummy 0.4-113.5 MG Chew Chew 1 tablet by mouth daily.   ferrous sulfate 325 (65 FE) MG EC  tablet Take 1 tablet (325 mg total) by mouth 2 (two) times daily.   ibuprofen 600 MG tablet Commonly known as: ADVIL Take 1 tablet (600 mg total) by mouth every 8 (eight) hours as needed for mild pain.        Discharge home in stable condition Infant Feeding: Bottle and Breast Infant Disposition:home with mother Discharge instruction: per After Visit Summary and Postpartum booklet. Activity: Advance as tolerated. Pelvic rest for 6 weeks.  Diet: routine diet Future Appointments: Future Appointments  Date Time Provider Herman  06/01/2020  1:00 PM Leftwich-Kirby, Kathie Dike, CNM [facility] None   Follow up Visit: [place] Follow up.   Why: in 4 weeks for a postpartum appointment Contact information: [address] [place] [address] [phone]           Please schedule this patient for a Virtual postpartum visit in 4 weeks with the following provider: Any provider. Additional Postpartum F/U:None  High risk pregnancy complicated by: fetal growth restriction Delivery mode:  Vaginal, Spontaneous  Anticipated Birth Control:  Nexplanon   05/02/2020 Wende Mott, CNM

## 2020-05-01 ENCOUNTER — Inpatient Hospital Stay (HOSPITAL_COMMUNITY): Admission: RE | Admit: 2020-05-01 | Payer: Medicaid Other | Source: Ambulatory Visit

## 2020-05-01 DIAGNOSIS — Z30017 Encounter for initial prescription of implantable subdermal contraceptive: Secondary | ICD-10-CM

## 2020-05-01 MED ORDER — LIDOCAINE HCL 1 % IJ SOLN
0.0000 mL | Freq: Once | INTRAMUSCULAR | Status: AC | PRN
  Administered 2020-05-01: 4 mL via INTRADERMAL
  Filled 2020-05-01 (×2): qty 20

## 2020-05-01 MED ORDER — ETONOGESTREL 68 MG ~~LOC~~ IMPL
68.0000 mg | DRUG_IMPLANT | Freq: Once | SUBCUTANEOUS | Status: AC
  Administered 2020-05-01: 68 mg via SUBCUTANEOUS
  Filled 2020-05-01 (×2): qty 1

## 2020-05-01 MED ORDER — COMPLETENATE 29-1 MG PO CHEW
1.0000 | CHEWABLE_TABLET | Freq: Every day | ORAL | Status: DC
  Administered 2020-05-01 – 2020-05-02 (×2): 1 via ORAL
  Filled 2020-05-01 (×2): qty 1

## 2020-05-01 NOTE — Progress Notes (Signed)
Post Partum Day 1 Subjective: no complaints, up ad lib, voiding and tolerating PO, small lochia, plans to breastfeed, wants Nexplanon placed today  Objective: Blood pressure 114/81, pulse 75, temperature 97.9 F (36.6 C), temperature source Oral, resp. rate 16, height 5\' 5"  (1.651 m), weight 56.7 kg, last menstrual period 08/01/2019, SpO2 100 %, unknown if currently breastfeeding.  Physical Exam:  General: alert, cooperative and no distress Lochia:normal flow Chest: CTAB Heart: RRR no m/r/g Abdomen: +BS, soft, nontender,  Uterine Fundus: firm DVT Evaluation: No evidence of DVT seen on physical exam. Extremities: no edema  Recent Labs    04/30/20 0828  HGB 9.2*  HCT 31.6*    Assessment/Plan: Plan for discharge tomorrow   LOS: 1 day   Christin Fudge 05/01/2020, 8:15 AM

## 2020-05-01 NOTE — Anesthesia Postprocedure Evaluation (Signed)
Anesthesia Post Note  Patient: Roberta Alexander  Procedure(s) Performed: AN AD [place]     Patient location during evaluation: Mother Baby Anesthesia Type: Epidural Level of consciousness: awake and alert Pain management: pain level controlled Vital Signs Assessment: post-procedure vital signs reviewed and stable Respiratory status: spontaneous breathing, nonlabored ventilation and respiratory function stable Cardiovascular status: stable Postop Assessment: no headache, no backache and epidural receding Anesthetic complications: no    Last Vitals:  Vitals:   05/01/20 0100 05/01/20 0500  BP: 109/66 114/81  Pulse: 70 75  Resp: 16 16  Temp: 36.9 C 36.6 C  SpO2:      Last Pain:  Vitals:   05/01/20 0500  TempSrc: Oral  PainSc:    Pain Goal:                   Kambry Takacs

## 2020-05-01 NOTE — Procedures (Addendum)
  No contraindications for placement.  No liver disease, no unexplained vaginal bleeding, no h/o breast cancer, no h/o blood clots.  Patient's last menstrual period was 08/01/2019 (approximate).  Risks & benefits of Nexplanon discussed Packaging instructions supplied to patient Consent form signed  No current facility-administered medications on file prior to encounter.   Current Outpatient Medications on File Prior to Encounter  Medication Sig Dispense Refill   Prenatal Vit-Min-FA-Fish Oil ([employer]S PRENATAL GUMMY) 0.4-113.5 MG CHEW Chew 1 tablet by mouth daily.      docusate sodium (COLACE) 100 MG capsule Take 1 capsule (100 mg total) by mouth daily. (Patient not taking: Reported on 03/31/2020) 45 capsule 1   Elastic Bandages & Supports (COMFORT FIT MATERNITY SUPP LG) MISC 1 Units by Does not apply route as needed (Remove at bedtime). 1 each 0   ferrous sulfate 325 (65 FE) MG EC tablet Take 1 tablet (325 mg total) by mouth 2 (two) times daily. (Patient not taking: Reported on 03/31/2020) 60 tablet 3    The patient denies any allergies to anesthetics or antiseptics.  Patient Active Problem List   Diagnosis Date Noted   IUGR (intrauterine growth restriction) affecting care of mother 04/22/2020   Anemia during pregnancy in second trimester 01/09/2020   Placenta previa antepartum 12/12/2019   Mildly underweight adult 11/12/2019   Supervision of other normal pregnancy, antepartum 11/06/2019    Procedure: Pt was placed in supine position. Left arm was flexed at the elbow and externally rotated so that her wrist was parallel to her ear The medial epicondyle of the left arm was identified The insertions site was marked 8 cm proximal to the medial epicondyle The insertion site was cleaned with Betadine The area surrounding the insertion site was covered with a sterile drape 1% lidocaine was injected just under the skin at the insertion site extending 4 cm proximally. The sterile preloaded  disposable Nexaplanon applicator was removed from the sterile packaging The applicator needle was inserted at a 30 degree angle at 8 cm proximal to the medial epicondyle as marked The applicator was lowered to a horizontal position and advanced just under the skin for the full length of the needle The slider on the applicator was retracted fully while the applicator remained in the same position, then the applicator was removed. The implant was confirmed via palpation as being in position The implant position was demonstrated to the patient Pressure dressing was applied to the patient.  The patient was instructed to removed the pressure dressing in 24 hrs.  The patient was advised to move slowly from a supine to an upright position  The patient denied any concerns or complaints  Matilde Haymaker, MD Family Medicine Resident PGY-2   OB FELLOW ATTESTATION  I agree with above documentation in the resident's note.  Augustin Coupe, MD/MPH OB Fellow  05/01/2020, 5:52 PM

## 2020-05-02 MED ORDER — IBUPROFEN 600 MG PO TABS: 600.0000 mg | ORAL_TABLET | Freq: Three times a day (TID) | ORAL | 0 refills | Status: DC | PRN

## 2020-05-02 MED ORDER — FERROUS SULFATE 325 (65 FE) MG PO TBEC
325.0000 mg | DELAYED_RELEASE_TABLET | Freq: Two times a day (BID) | ORAL | 3 refills | Status: DC
Start: 2020-05-02 — End: 2021-05-20

## 2020-05-02 NOTE — Discharge Instructions (Signed)
Please continue to take your oral Iron tablets with plenty of fluids.   Vaginal Delivery  Vaginal delivery means that you give birth by pushing your baby out of your birth canal (vagina). A team of health care providers will help you before, during, and after vaginal delivery. Birth experiences are unique for every woman and every pregnancy, and birth experiences vary depending on where you choose to give birth. What happens when I arrive at the birth center or hospital? Once you are in labor and have been admitted into the hospital or birth center, your health care provider may:  Review your pregnancy history and any concerns that you have.  Insert an IV into one of your veins. This may be used to give you fluids and medicines.  Check your blood pressure, pulse, temperature, and heart rate (vital signs).  Check whether your bag of water (amniotic sac) has broken (ruptured).  Talk with you about your birth plan and discuss pain control options. Monitoring Your health care provider may monitor your contractions (uterine monitoring) and your baby's heart rate (fetal monitoring). You may need to be monitored:  Often, but not continuously (intermittently).  All the time or for long periods at a time (continuously). Continuous monitoring may be needed if: ? You are taking certain medicines, such as medicine to relieve pain or make your contractions stronger. ? You have pregnancy or labor complications. Monitoring may be done by:  Placing a special stethoscope or a handheld monitoring device on your abdomen to check your baby's heartbeat and to check for contractions.  Placing monitors on your abdomen (external monitors) to record your baby's heartbeat and the frequency and length of contractions.  Placing monitors inside your uterus through your vagina (internal monitors) to record your baby's heartbeat and the frequency, length, and strength of your contractions. Depending on the type  of monitor, it may remain in your uterus or on your baby's head until birth.  Telemetry. This is a type of continuous monitoring that can be done with external or internal monitors. Instead of having to stay in bed, you are able to move around during telemetry. Physical exam Your health care provider may perform frequent physical exams. This may include:  Checking how and where your baby is positioned in your uterus.  Checking your cervix to determine: ? Whether it is thinning out (effacing). ? Whether it is opening up (dilating). What happens during labor and delivery?  Normal labor and delivery is divided into the following three stages: Stage 1  This is the longest stage of labor.  This stage can last for hours or days.  Throughout this stage, you will feel contractions. Contractions generally feel mild, infrequent, and irregular at first. They get stronger, more frequent (about every 2-3 minutes), and more regular as you move through this stage.  This stage ends when your cervix is completely dilated to 4 inches (10 cm) and completely effaced. Stage 2  This stage starts once your cervix is completely effaced and dilated and lasts until the delivery of your baby.  This stage may last from 20 minutes to 2 hours.  This is the stage where you will feel an urge to push your baby out of your vagina.  You may feel stretching and burning pain, especially when the widest part of your baby's head passes through the vaginal opening (crowning).  Once your baby is delivered, the umbilical cord will be clamped and cut. This usually occurs after waiting a period  of 1-2 minutes after delivery.  Your baby will be placed on your bare chest (skin-to-skin contact) in an upright position and covered with a warm blanket. Watch your baby for feeding cues, like rooting or sucking, and help the baby to your breast for his or her first feeding. Stage 3  This stage starts immediately after the birth  of your baby and ends after you deliver the placenta.  This stage may take anywhere from 5 to 30 minutes.  After your baby has been delivered, you will feel contractions as your body expels the placenta and your uterus contracts to control bleeding. What can I expect after labor and delivery?  After labor is over, you and your baby will be monitored closely until you are ready to go home to ensure that you are both healthy. Your health care team will teach you how to care for yourself and your baby.  You and your baby will stay in the same room (rooming in) during your hospital stay. This will encourage early bonding and successful breastfeeding.  You may continue to receive fluids and medicines through an IV.  Your uterus will be checked and massaged regularly (fundal massage).  You will have some soreness and pain in your abdomen, vagina, and the area of skin between your vaginal opening and your anus (perineum).  If an incision was made near your vagina (episiotomy) or if you had some vaginal tearing during delivery, cold compresses may be placed on your episiotomy or your tear. This helps to reduce pain and swelling.  You may be given a squirt bottle to use instead of wiping when you go to the bathroom. To use the squirt bottle, follow these steps: ? Before you urinate, fill the squirt bottle with warm water. Do not use hot water. ? After you urinate, while you are sitting on the toilet, use the squirt bottle to rinse the area around your urethra and vaginal opening. This rinses away any urine and blood. ? Fill the squirt bottle with clean water every time you use the bathroom.  It is normal to have vaginal bleeding after delivery. Wear a sanitary pad for vaginal bleeding and discharge. Summary  Vaginal delivery means that you will give birth by pushing your baby out of your birth canal (vagina).  Your health care provider may monitor your contractions (uterine monitoring) and your  baby's heart rate (fetal monitoring).  Your health care provider may perform a physical exam.  Normal labor and delivery is divided into three stages.  After labor is over, you and your baby will be monitored closely until you are ready to go home. This information is not intended to replace advice given to you by your health care provider. Make sure you discuss any questions you have with your health care provider. Document Revised: 01/02/2018 Document Reviewed: 01/02/2018 Elsevier Patient Education  2020 [employer].

## 2020-05-02 NOTE — Progress Notes (Signed)
CSW received consult for Edinburgh Score of 11. CSW met with MOB to offer support and complete assessment.  CSW entered room, identified self, and explained purpose of visit. MOB was breastfeeding infant, Jah'Leah on arrival. MOB was pleasant and engaged during visit.  MOB explained EDS score was related to FOB serving [place] in the [employer]. MOB reported it has been difficult going through pregnancy and delivery without him. MOB stated she lives with her first child's father and he has been very supportive. MOB reported her best friend has been helpful as well, however, has five children of her own. MOB denied any SI, HI, or domestic violence concerns. MOB reported no BH dx, however, stated she believes she experienced postpartum depression after first child. MOB reported postpartum sx as no appetite, irritability, and little sleep. MOB is unsure of duration.  MOB said even when baby would be asleep she would be wide awake. CSW offered support,  Encouragement, and recommended scheduling and self-care strategies such as walking and baths. MOB stated she has been worrying some about parenting two children, but she knows things will work out. MOB reported six y/o child is excited about new sister. CSW encouraged MOB to make experience fun by including 62 y/o daughter infants care. CSW also recommended always putting safety first, washing clothes and dishes can wait until FOB of six year old gets home. MOB reported FOB works close to home and comes home to check on them often.  CSW encourage MOB to consider counseling. MOB reported she has always been interested yet never pursued it. CSW provided counseling resources list. MOB agreed to get connected soon. MOB reported current mood as "good" because of Jah'Leah.   CSW provided education regarding the baby blues period vs. perinatal mood disorders, discussed treatment and gave resources for mental health follow up if concerns arise.  CSW recommends self-evaluation  during the postpartum time period using the New Mom Checklist from Postpartum Progress and encouraged MOB to contact a medical professional if symptoms are noted at any time. MOB denied any questions.    CSW provided review of Sudden Infant Death Syndrome (SIDS) precautions. MOB conformed having all needed items for baby including new car seat and bassinet for infant's safe sleep.    CSW identifies no further need for intervention and no barriers to discharge at this time.  Isbella Arline D. Roberta Alexander, MSW, [facility] Clinical Social Worker [phone]

## 2020-05-04 LAB — SURGICAL PATHOLOGY

## 2020-05-07 ENCOUNTER — Ambulatory Visit: Payer: Medicaid Other

## 2020-06-01 ENCOUNTER — Telehealth: Payer: Medicaid Other | Admitting: Student

## 2020-06-01 DIAGNOSIS — Z3480 Encounter for supervision of other normal pregnancy, unspecified trimester: Secondary | ICD-10-CM

## 2021-03-04 ENCOUNTER — Ambulatory Visit: Payer: Medicaid Other | Admitting: Obstetrics

## 2021-04-19 ENCOUNTER — Ambulatory Visit (HOSPITAL_COMMUNITY)
Admission: EM | Admit: 2021-04-19 | Discharge: 2021-04-19 | Disposition: A | Discharge status: Home / Self Care | Payer: Medicaid Other | Attending: Emergency Medicine | Admitting: Emergency Medicine

## 2021-04-19 ENCOUNTER — Other Ambulatory Visit: Payer: Self-pay

## 2021-04-19 ENCOUNTER — Encounter (HOSPITAL_COMMUNITY): Payer: Self-pay

## 2021-04-19 DIAGNOSIS — K0889 Other specified disorders of teeth and supporting structures: Secondary | ICD-10-CM

## 2021-04-19 DIAGNOSIS — N939 Abnormal uterine and vaginal bleeding, unspecified: Secondary | ICD-10-CM | POA: Diagnosis not present

## 2021-04-19 MED ORDER — TRAMADOL HCL 50 MG PO TABS: 50.0000 mg | ORAL_TABLET | Freq: Four times a day (QID) | ORAL | 0 refills | Status: DC | PRN

## 2021-04-19 NOTE — ED Triage Notes (Addendum)
Pt reports dental pain for about a month. States is here for assistance with pain relief. No obvious external swelling noted. Some redness noted to gum on upper right molar.  Pt notes she has had vaginal bleeding continuously since having her daughter last year. Pt states that she wants to be set up with a primary care doctor to help her with this.

## 2021-04-19 NOTE — ED Provider Notes (Signed)
[place]    CSN: [id number] Arrival date & time: 04/19/21  0109      History   Chief Complaint Chief Complaint  Patient presents with  . Dental Pain    HPI Roberta [place] is a 25 y.o. female.   Pt has rt upper back tooth pain for 1 month now. States that she does not have fever n/v with it. Has not seen a dentist. Is taking motrin as needed for pain.   Pt is also looking for a pcp she has an obgyn that she is seeing for the vaginal bleeding. Has had intermit irregular bleeding for years has tried IUD, birth control, and multiple medications for this. She does have an appoint this month with obgyn for this. Nothing is out of the normal bleeding today. No urgency no frequency, does not think she is preg.      Past Medical History:  Diagnosis Date  . Anemia     Patient Active Problem List   Diagnosis Date Noted  . IUGR (intrauterine growth restriction) affecting care of mother 04/22/2020  . Anemia during pregnancy in second trimester 01/09/2020  . Placenta previa antepartum 12/12/2019  . Mildly underweight adult 11/12/2019  . Supervision of other normal pregnancy, antepartum 11/06/2019    Past Surgical History:  Procedure Laterality Date  . INDUCED ABORTION      OB History    Gravida  3   Para  2   Term  2   Preterm      AB  1   Living  2     SAB      IAB      Ectopic      Multiple  0   Live Births  2            Home Medications    Prior to Admission medications   Medication Sig Start Date End Date Taking? Authorizing Provider  ibuprofen (ADVIL) 600 MG tablet Take 1 tablet (600 mg total) by mouth every 8 (eight) hours as needed for mild pain. 05/02/20  Yes Wende Mott, CNM  traMADol (ULTRAM) 50 MG tablet Take 1 tablet (50 mg total) by mouth every 6 (six) hours as needed. 04/19/21  Yes Marney Setting, NP  Elastic Bandages & Supports (COMFORT FIT MATERNITY SUPP LG) MISC 1 Units by Does not apply route as needed (Remove at  bedtime). 01/09/20   Gavin Pound, CNM  ferrous sulfate 325 (65 FE) MG EC tablet Take 1 tablet (325 mg total) by mouth 2 (two) times daily. 05/02/20   Wende Mott, CNM  Prenatal Vit-Min-FA-Fish Oil ([employer] PRENATAL GUMMY) 0.4-113.5 MG CHEW Chew 1 tablet by mouth daily.     [provider]    Family History History reviewed. No pertinent family history.  Social History Social History   Tobacco Use  . Smoking status: Current Every Day Smoker    Types: Cigarettes    Last attempt to quit: 12/17/2018    Years since quitting: 2.3  . Smokeless tobacco: Never Used  . Tobacco comment: 5 cigarettes per day  Vaping Use  . Vaping Use: Never used  Substance Use Topics  . Alcohol use: Not Currently  . Drug use: Not Currently     Allergies   Patient has no known allergies.   Review of Systems Review of Systems  Constitutional: Negative.   HENT: Positive for dental problem.        Pain to rt upper tooth  Respiratory: Negative.   Cardiovascular: Negative.   Gastrointestinal: Negative.   Genitourinary: Positive for vaginal bleeding.       Intermit for the past 4-5 years   Neurological: Negative.      Physical Exam Triage Vital Signs ED Triage Vitals  Enc Vitals Group     BP 04/19/21 0959 120/79     Pulse Rate 04/19/21 0959 84     Resp 04/19/21 0959 18     Temp 04/19/21 0957 98.2 F (36.8 C)     Temp src --      SpO2 04/19/21 0959 100 %     Weight --      Height --      Head Circumference --      Peak Flow --      Pain Score 04/19/21 0953 8     Pain Loc --      Pain Edu? --      Excl. in [place]? --    No data found.  Updated Vital Signs BP 120/79   Pulse 84   Temp 98.2 F (36.8 C)   Resp 18   SpO2 100%   Visual Acuity Right Eye Distance:   Left Eye Distance:   Bilateral Distance:    Right Eye Near:   Left Eye Near:    Bilateral Near:     Physical Exam Constitutional:      Appearance: Normal appearance.  HENT:     Mouth/Throat:     Comments:  Rt upper wisdom tooth visible , no erythema, no signs of infection or abscess. No dental decay  Eyes:     Pupils: Pupils are equal, round, and reactive to light.  Cardiovascular:     Rate and Rhythm: Normal rate.     Pulses: Normal pulses.  Pulmonary:     Effort: Pulmonary effort is normal.  Abdominal:     General: Abdomen is flat.  Neurological:     Mental Status: She is alert.      UC Treatments / Results  Labs (all labs ordered are listed, but only abnormal results are displayed) Labs Reviewed - No data to display  EKG   Radiology No results found.  Procedures Procedures (including critical care time)  Medications Ordered in UC Medications - No data to display  Initial Impression / Assessment and Plan / UC Course  I have reviewed the triage vital signs and the nursing notes.  Pertinent labs & imaging results that were available during my care of the patient were reviewed by me and considered in my medical decision making (see chart for details).     Pt has appoint this month with obgyn to discuss options for vaginal bleeding expressed that this has been a issue for years  Given info and names of dental to make an appointment  Expressed that no visible infection at this time and no abx is needed  Take pain meds and motrin prn  Pt asking for info on a pcp will print this for her  Final Clinical Impressions(s) / UC Diagnoses   Final diagnoses:  Pain, dental  Complaint of vaginal bleeding   Discharge Instructions   None    ED Prescriptions    Medication Sig Dispense Auth. Provider   traMADol (ULTRAM) 50 MG tablet Take 1 tablet (50 mg total) by mouth every 6 (six) hours as needed. 15 tablet Marney Setting, NP     I have reviewed the PDMP during this encounter.   Marney Setting,  NP 04/19/21 1027

## 2021-04-22 ENCOUNTER — Telehealth (HOSPITAL_BASED_OUTPATIENT_CLINIC_OR_DEPARTMENT_OTHER): Payer: Self-pay

## 2021-04-22 NOTE — Telephone Encounter (Signed)
-----   Message from Curt Jews, RN sent at 04/22/2021  1:30 PM EDT ----- Regarding: UC to PCP Patient needs to establish with PCP - routine

## 2021-05-03 ENCOUNTER — Telehealth (HOSPITAL_BASED_OUTPATIENT_CLINIC_OR_DEPARTMENT_OTHER): Payer: Self-pay

## 2021-05-03 NOTE — Telephone Encounter (Signed)
LVMTCB regarding moving pts appt up to 8:10 on 5/27 for her NP appt with DNP Clarise Cruz Early. Please advise.

## 2021-05-07 ENCOUNTER — Ambulatory Visit (HOSPITAL_BASED_OUTPATIENT_CLINIC_OR_DEPARTMENT_OTHER): Payer: Medicaid Other | Admitting: Nurse Practitioner

## 2021-05-07 ENCOUNTER — Other Ambulatory Visit (HOSPITAL_BASED_OUTPATIENT_CLINIC_OR_DEPARTMENT_OTHER)
Admission: RE | Admit: 2021-05-07 | Discharge: 2021-05-07 | Disposition: A | Discharge status: Home / Self Care | Payer: Medicaid Other | Source: Ambulatory Visit | Attending: Nurse Practitioner | Admitting: Nurse Practitioner

## 2021-05-07 ENCOUNTER — Other Ambulatory Visit: Payer: Self-pay

## 2021-05-07 ENCOUNTER — Encounter (HOSPITAL_BASED_OUTPATIENT_CLINIC_OR_DEPARTMENT_OTHER): Payer: Self-pay | Admitting: Nurse Practitioner

## 2021-05-07 VITALS — BP 118/86 | Ht 65.0 in | Wt 95.2 lb

## 2021-05-07 DIAGNOSIS — Z13228 Encounter for screening for other metabolic disorders: Secondary | ICD-10-CM

## 2021-05-07 DIAGNOSIS — H538 Other visual disturbances: Secondary | ICD-10-CM | POA: Diagnosis not present

## 2021-05-07 DIAGNOSIS — R636 Underweight: Secondary | ICD-10-CM

## 2021-05-07 DIAGNOSIS — Z1329 Encounter for screening for other suspected endocrine disorder: Secondary | ICD-10-CM | POA: Diagnosis not present

## 2021-05-07 DIAGNOSIS — Z681 Body mass index (BMI) 19 or less, adult: Secondary | ICD-10-CM

## 2021-05-07 DIAGNOSIS — K581 Irritable bowel syndrome with constipation: Secondary | ICD-10-CM | POA: Insufficient documentation

## 2021-05-07 DIAGNOSIS — N926 Irregular menstruation, unspecified: Secondary | ICD-10-CM | POA: Insufficient documentation

## 2021-05-07 DIAGNOSIS — F411 Generalized anxiety disorder: Secondary | ICD-10-CM

## 2021-05-07 DIAGNOSIS — Z7689 Persons encountering health services in other specified circumstances: Secondary | ICD-10-CM

## 2021-05-07 DIAGNOSIS — D500 Iron deficiency anemia secondary to blood loss (chronic): Secondary | ICD-10-CM

## 2021-05-07 DIAGNOSIS — F322 Major depressive disorder, single episode, severe without psychotic features: Secondary | ICD-10-CM

## 2021-05-07 DIAGNOSIS — Z1321 Encounter for screening for nutritional disorder: Secondary | ICD-10-CM | POA: Insufficient documentation

## 2021-05-07 DIAGNOSIS — Z130 Encounter for screening for diseases of the blood and blood-forming organs and certain disorders involving the immune mechanism: Secondary | ICD-10-CM | POA: Insufficient documentation

## 2021-05-07 HISTORY — DX: Persons encountering health services in other specified circumstances: Z76.89

## 2021-05-07 LAB — CBC WITH DIFFERENTIAL/PLATELET
Abs Immature Granulocytes: 0.01 10*3/uL (ref 0.00–0.07)
Basophils Absolute: 0 10*3/uL (ref 0.0–0.1)
Basophils Relative: 1 %
Eosinophils Absolute: 0.1 10*3/uL (ref 0.0–0.5)
Eosinophils Relative: 1 %
HCT: 39.9 % (ref 36.0–46.0)
Hemoglobin: 13.3 g/dL (ref 12.0–15.0)
Immature Granulocytes: 0 %
Lymphocytes Relative: 37 %
Lymphs Abs: 2.3 10*3/uL (ref 0.7–4.0)
MCH: 32.9 pg (ref 26.0–34.0)
MCHC: 33.3 g/dL (ref 30.0–36.0)
MCV: 98.8 fL (ref 80.0–100.0)
Monocytes Absolute: 0.3 10*3/uL (ref 0.1–1.0)
Monocytes Relative: 5 %
Neutro Abs: 3.6 10*3/uL (ref 1.7–7.7)
Neutrophils Relative %: 56 %
Platelets: 237 10*3/uL (ref 150–400)
RBC: 4.04 MIL/uL (ref 3.87–5.11)
RDW: 12 % (ref 11.5–15.5)
WBC: 6.4 10*3/uL (ref 4.0–10.5)
nRBC: 0 % (ref 0.0–0.2)

## 2021-05-07 LAB — COMPREHENSIVE METABOLIC PANEL
ALT: 10 U/L (ref 0–44)
AST: 15 U/L (ref 15–41)
Albumin: 4 g/dL (ref 3.5–5.0)
Alkaline Phosphatase: 63 U/L (ref 38–126)
Anion gap: 6 (ref 5–15)
BUN: 5 mg/dL — ABNORMAL LOW (ref 6–20)
CO2: 24 mmol/L (ref 22–32)
Calcium: 8.7 mg/dL — ABNORMAL LOW (ref 8.9–10.3)
Chloride: 109 mmol/L (ref 98–111)
Creatinine, Ser: 0.68 mg/dL (ref 0.44–1.00)
GFR, Estimated: >60 mL/min (ref >60)
Glucose, Bld: 83 mg/dL (ref 70–99)
Potassium: 3.8 mmol/L (ref 3.5–5.1)
Sodium: 139 mmol/L (ref 135–145)
Total Bilirubin: 0.9 mg/dL (ref 0.3–1.2)
Total Protein: 6.3 g/dL — ABNORMAL LOW (ref 6.5–8.1)

## 2021-05-07 MED ORDER — SERTRALINE HCL 50 MG PO TABS: ORAL_TABLET | ORAL | 3 refills | Status: DC

## 2021-05-07 MED ORDER — MEDROXYPROGESTERONE ACETATE 10 MG PO TABS: 10.0000 mg | ORAL_TABLET | Freq: Every day | ORAL | 0 refills | Status: DC

## 2021-05-07 MED ORDER — DICYCLOMINE HCL 10 MG PO CAPS: 10.0000 mg | ORAL_CAPSULE | Freq: Three times a day (TID) | ORAL | 3 refills | Status: DC

## 2021-05-07 NOTE — Progress Notes (Signed)
Worthy Keeler, DNP, AGNP-c Primary Care Services ______________________________________________________________________________________________________________________________________________  HPI Roberta Alexander is a 25 y.o. female presenting to [facility] [place] today to establish care.   Patient Care Team: Mia Milan, Coralee Pesa, NP as PCP - General (Nurse Practitioner)   Concerns today: . Abnormal Uterine Bleeding o Constant menstrual bleeding since nexplanon insertion 13 months ago o Insertion immediately PP o Flow heavy, up to 6 pads per day o No severe cramping, dizziness, lightheadedness, weakness o No longer breast feeding- milk supply dried around 3 months PP o Hx of abnormal bleeding with IUD previously o No STI hx, monogamous relationship with husband, no s/s of STI . Depression/Anxiety o PHQ-9 and GAD-7 positive for depression and anxiety symptoms o Reports symptoms have been "for a long time", but "seem to be worse every day" o Endorses increased worry and short temper. o Denies SI/HI/Self-harm o Never been on medication- mother is on sertraline . Abdominal Pain o Intermittent abdominal pain almost daily for many years- worse after the birth of her oldest child o Frequent abdominal cramping from mild to severe o Location of cramping moves around o Bloating and constipation common with intermittent nausea o Unsure of provoking factr o Pain often relieved with BM o Was told as a child she had IBS o No fevers, chills, blood in stool, vomiting  Narrative: Roberta Alexander is married, she has 2 children, two girls 7 and 1 years, both currently living at home. She reports she is safe in her current relationships and home environment. She does not have a history or partner abuse.  She is currently a homemaker.  SHe recently quit work to be at home with her children. She endorses walking regularly- she eats on average 3 small  meals a day- no special dietary restrictions. Abdominal pain does keep her from eating consistently and regularly.  She admits to nicotine use, deniesrecreational drug use currently, admits to occassional alcohol use. She currently sexually active with one female partner.   She reports constant menses with heavy flow- up to 6 pads per day. She has bled nearly continuously since having the Nexplanon placed. Placed immediately after delivery. She is not planning pregnancy in the near future. Contraceptive options include Nexplanon She reports STI history of STDs: none and denies concerns for STI today.  She denies recent changes to bowel habits concerns have been consistent for "a long time", denies recent changes to bladder habits, denies recent changes to skin.  She reports recent mood related changes. PHQ and GAD listed below.  PHQ9 Today: Depression screen PHQ 2/9 05/07/2021  Decreased Interest 2  Down, Depressed, Hopeless 3  PHQ - 2 Score 5  Altered sleeping 3  Tired, decreased energy 3  Change in appetite 3  Feeling bad or failure about yourself  2  Trouble concentrating 0  Moving slowly or fidgety/restless 0  Suicidal thoughts 0  PHQ-9 Score 16   GAD7 Today: GAD 7 : Generalized Anxiety Score 05/07/2021  Nervous, Anxious, on Edge 3  Control/stop worrying 3  Worry too much - different things 3  Trouble relaxing 3  Restless 3  Easily annoyed or irritable 3  Afraid - awful might happen 2  Total GAD 7 Score 20    Health Maintenance Due  Topic Date Due  . HPV VACCINES (1 - 2-dose series) Never done  . Hepatitis C Screening  Never done  . TETANUS/TDAP  Never done   PMH Past Medical History:  Diagnosis Date  . Anemia   . Anemia during pregnancy in second trimester 01/09/2020  . IUGR (intrauterine growth restriction) affecting care of mother 04/22/2020  . Placenta previa antepartum 12/12/2019  . Supervision of other normal pregnancy, antepartum 11/06/2019   .Marland Kitchen Nursing  Staff Provider Office Location  [facility] Dating  LMP Language  English Anatomy US   Flu Vaccine  Declined  Genetic Screen  NIPS:   AFP:   First Screen:  Quad:   TDaP vaccine   gave info 04-16-20 Hgb A1C or  GTT Wylie Coon  Third trimester: 1 hr WNL Rhogam     LAB RESULTS  Feeding Plan Breast/Bottle Blood Type B/Positive/-- (12/01 1021)  Contraception Nexplanon Antibody Negative (12/01 1021) Circ   ROS All review of systems negative except what is listed in the HPI  PHYSICAL EXAM General appearance: alert, cooperative and no distress, Resp: clear to auscultation bilaterally and normal percussion bilaterally, Cardio: regular rate and rhythm, S1, S2 normal, no murmur, click, rub or gallop and prominent apical impulse, GI: normal findings: aorta normal, liver span normal to percussion, no bruits heard, no organomegaly, spleen non-palpable, symmetric and umbilicus normal and abnormal findings:  hyperactive bowel sounds and tenderness noted to the LLQ with palpation, no rebound or distension noted.   ASSESSMENT AND PLAN Problem List Items Addressed This Visit    Mildly underweight adult    BMI 15.84 today Pt reports 3 small meals per day with limitations on intake due to abdominal pain thought to be from IBS FODMAP diet plan provided for patient with recommendations for increased caloric intake through small meals and use of Bentyl for abdominal spasms.  Anxiety and Depression likely playing a role, as well- medication and counseling referral today No warning signs present for eating d/o F/U in 2 weeks      Relevant Orders   Thyroid Profile   CBC w/Diff/Platelet (Completed)   Comprehensive metabolic panel (Completed)   Screening for endocrine, nutritional, metabolic and immunity disorder   Relevant Orders   Thyroid Profile   Comprehensive metabolic panel (Completed)   Iron deficiency anemia due to chronic blood loss    Ongoing menses for the past year Heavy days with 6 pads per day many days Hx of  anemia during pregnancy Will monitor blood levels today Starting progesterone challenge today to reset cycle- consider OCP challenge for 1-3 months if not effective Nexplanon implant removal may be required if ineffective at stopping bleeding.       Relevant Orders   CBC w/Diff/Platelet (Completed)   Depression, major, single episode, severe (HCC)    PHQ-9 16 today No SI/HI/Self-harm present- no alarm signs Ongoing for about 7years- no medication or counseling in past Recommend counseling services and SSRI today Start sertraline taper to 50mg  over 7 days F/U in 2 weeks for re-evaluation      Relevant Medications   sertraline (ZOLOFT) 50 MG tablet   Other Relevant Orders   Ambulatory referral to Psychology   GAD (generalized anxiety disorder)    GAD7 20 today Mood dysregulation reported ongoing for quit some time- possibly since the birth of her first child (7 years) Recommend counseling services and medication with SSRI to help stabilize mood and improve QOL No alarm signs present today- no SI/HI/Self-Harm Started sertraline taper to 50mg  over one week.  Reassess in 2 weeks.      Relevant Medications   sertraline (ZOLOFT) 50 MG tablet   Other Relevant Orders   Ambulatory referral to Psychology  Irritable bowel syndrome with constipation    Shifting abdominal pain- improved with BM with no alarm sx present. Sx consistent with IBS in the setting of increased anxiety and depression Discussed etiology of IBS and dietary recommendation provided Will trial benyl before meals to see if this is helpful for cramping and pain Starting sertraline and referral for counseling for depression/anxiety F/U in 2 weeks or sooner if needed      Relevant Medications   dicyclomine (BENTYL) 10 MG capsule   sertraline (ZOLOFT) 50 MG tablet   Other Relevant Orders   Thyroid Profile   Encounter to establish care - Primary    Review of current and past medical history, social history,  medication, and family history.  Review of care gaps and health maintenance recommendations.  Records from recent providers to be requested if not available in Chart Review or Care Everywhere.  Recommendations for health maintenance, diet, and exercise provided.  Immediate health concerns addressed with patient Will need f/u for CPE       Irregular menstrual bleeding    Chronic menstrual bleeding since placement of Nexplanon implant post partum Up to 6 pads per day No warning signs present, but history of anemia with pregnancy- will check this today No abdominal mass or irregularities in pelvic region assessed today- vaginal exam deferred at this time Progesterone challenge with 7-10 days of provera started today Consider combined OCP challenge if this is not effected for resetting menses- otherwise, will need to remove implant and seek a different form of contraception Pt agreeable to plan      Relevant Medications   medroxyPROGESTERone (PROVERA) 10 MG tablet   Other Relevant Orders   Thyroid Profile   Comprehensive metabolic panel (Completed)    Other Visit Diagnoses    Vision blurred       Relevant Orders   Ambulatory referral to Ophthalmology   Need for referral to dentistry for poor dentition       Relevant Orders   Ambulatory referral to Dentistry   Body mass index (BMI) of 19 or less in adult          Education provided today during visit and on AVS for patient to review at home.  Diet and Exercise recommendations provided.  Current diagnoses and recommendations discussed. HM recommendations reviewed with recommendations.    Outpatient Encounter Medications as of 05/07/2021  Medication Sig  . dicyclomine (BENTYL) 10 MG capsule Take 1 capsule (10 mg total) by mouth 3 (three) times daily before meals.  . medroxyPROGESTERone (PROVERA) 10 MG tablet Take 1 tablet (10 mg total) by mouth daily. For 7-10 days, expect menstruation around Day 7  . sertraline (ZOLOFT) 50 MG  tablet Take 1/2 tablet (25mg ) by mouth at bedtime for 6 days then increase to 1 tablet (50mg ) by mouth at bedtime every night.  . Elastic Bandages & Supports (COMFORT FIT MATERNITY SUPP LG) MISC 1 Units by Does not apply route as needed (Remove at bedtime).  . ferrous sulfate 325 (65 FE) MG EC tablet Take 1 tablet (325 mg total) by mouth 2 (two) times daily.  Marland Kitchen ibuprofen (ADVIL) 600 MG tablet Take 1 tablet (600 mg total) by mouth every 8 (eight) hours as needed for mild pain.  . Prenatal Vit-Min-FA-Fish Oil ([employer] PRENATAL GUMMY) 0.4-113.5 MG CHEW Chew 1 tablet by mouth daily.   . traMADol (ULTRAM) 50 MG tablet Take 1 tablet (50 mg total) by mouth every 6 (six) hours as needed.   No facility-administered encounter  medications on file as of 05/07/2021.    Return in about 2 weeks (around 05/21/2021) for f/u in 2 weeks in person or virtual ok.  Time:  70 minutes, >50% spent counseling, care coordination, chart review, and documentation.   Orma Render, DNP, AGNP-c

## 2021-05-07 NOTE — Assessment & Plan Note (Signed)
GAD7 20 today Mood dysregulation reported ongoing for quit some time- possibly since the birth of her first child (7 years) Recommend counseling services and medication with SSRI to help stabilize mood and improve QOL No alarm signs present today- no SI/HI/Self-Harm Started sertraline taper to 50mg  over one week.  Reassess in 2 weeks.

## 2021-05-07 NOTE — Assessment & Plan Note (Signed)
Review of current and past medical history, social history, medication, and family history.  Review of care gaps and health maintenance recommendations.  Records from recent providers to be requested if not available in Chart Review or Care Everywhere.  Recommendations for health maintenance, diet, and exercise provided.  Immediate health concerns addressed with patient Will need f/u for CPE

## 2021-05-07 NOTE — Patient Instructions (Addendum)
Recommendations from today's visit: I would like to see you back in 2 weeks to see how you are doing on the medication.   Provera- for your periods- take 1 tablet every day for 7-10 days- bleeding should lighten and then start heavier between day 7-10. Then it should be like a normal period and stop. If it does not stop, please let me know.   Bentyl- for stomach pains- you can take this right before every meal or just when you are having stomach pains- you may have to take it different ways to find out what works best for you. This will help with the cramping. Be sure you are drinking plenty of fluids.  Sertraline- this is for your mood. I want you to start taking 1/2 tablet for the first 6 days then increase to a full tablet. Take this at bedtime for best results. This medicine needs to be taken every day to work at its best, so try not to miss doses. If you miss a dose at bedtime, you can take the missed dose in the morning and then restart the regular dose that night.   I have sent a referral for an eye doctor and dentist- I will let you know if there are any issues.   We will check your blood levels and thyroid today to make sure that you don't have any thyroid issues and monitor your anemia.    . I have ordered labs for you to be completed today o Our lab is located on the GROUND floor of this building. o As you exit the main entrance elevators on the ground floor, enter through the glass doors to your left for the lab.  - This is also the imaging center, if you should need imaging. o Your labs do not require you to be fasting. - If you are required to fast, please be sure you have had nothing to eat for at least 8 hours prior to the test (black coffee and water are fine) - Please be sure you have drank plenty of water the day before to avoid dehydration- this can sometimes alter the tests and make the blood draw more difficult. o Our lab requires an appointment time. This can be set up  with our front office staff today before you leave, or you can call our office at [phone] to schedule this. Same day scheduling is available.   Information on diet, exercise, and health maintenance recommendations are listed below. This is information to help you be sure you are on track for optimal health and monitoring.   Please look over this and let us know if you have any questions or if you have completed any of the health maintenance outside of [facility] so that we can be sure your records are up to date.  ___________________________________________________________  Thank you for choosing [place] [facility] for your Primary Care needs. I am excited for the opportunity to partner with you to meet your health care goals. It was a pleasure meeting you today!  I am an Adult-Geriatric Nurse Practitioner with a background in caring for patients for more than 20 years. I received my Paediatric nurse in Nursing and my Doctor of Nursing Practice degrees at [employer]. I received additional fellowship training in primary care and sports medicine after receiving my doctorate degree. I provide primary care and sports medicine services to patients age 16 and older within this office. I am also a provider with the  [facility] and the director of the APP Fellowship with [facility].  I am a [place] native, but have called the [place] area home for nearly 20 years and am proud to be a member of this community.   I am passionate about providing the best service to you through preventive medicine and supportive care. I consider you a part of the medical team and value your input. I work diligently to ensure that you are heard and your needs are met in a safe and effective manner. I want you to feel comfortable with me as your provider and want you to know that your health concerns are important to me.   For your information, our  office hours are Monday- Friday 8:00 AM - 5:00 PM At this time I am not in the office on Wednesdays.  If you have questions or concerns, please call our office at [phone] or send [place] a MyChart message and we will respond as quickly as possible.   For all urgent or time sensitive needs we ask that you please call the office to avoid delays. MyChart is not constantly monitored and replies may take up to 72 business hours.  MyChart Policy: . MyChart allows for you to see your visit notes, after visit summary, provider recommendations, lab and tests results, make an appointment, request refills, and contact your provider or the office for non-urgent questions or concerns.  . Providers are seeing patients during normal business hours and do not have built in time to review MyChart messages. We ask that you allow a minimum of 72 business hours for MyChart message responses.  . Complex MyChart concerns may require a visit. Your provider may request you schedule a virtual or in person visit to ensure we are providing the best care possible. . MyChart messages sent after 4:00 PM on Friday will not be received by the provider until Monday morning.    Lab and Test Results: . You will receive your lab and test results on MyChart as soon as they are completed and results have been sent by the lab or testing facility. Due to this service, you will receive your results BEFORE your provider.  . Please allow a minimum of 72 business hours for your provider to receive and review lab and test results and contact you about.   . Most lab and test result comments from the provider will be sent through [place]. Your provider may recommend changes to the plan of care, follow-up visits, repeat testing, ask questions, or request an office visit to discuss these results. You may reply directly to this message or call the office at [phone] to provide information for the provider or set up an appointment. . In some  instances, you will be called with test results and recommendations. Please let us know if this is preferred and we will make note of this in your chart to provide this for you.    . If you have not heard a response to your lab or test results in 72 business hours, please call the office to let us know.   After Hours: . For all non-emergency after hours needs, please call the office at [phone] and select the option to reach the on-call provider service. On-call services are shared between multiple [facility] offices and therefore it will not be possible to speak directly with your provider. On-call providers may provide medical advice and recommendations, but are unable to provide refills  for maintenance medications.  . For all emergency or urgent medical needs after normal business hours, we recommend that you seek care at the closest Urgent Care or Emergency Department to ensure appropriate treatment in a timely manner.  [facility] [place] [facility] [place] has a 24 hour emergency room located on the ground floor for your convenience.    Please do not hesitate to reach out to [place] with concerns.   Thank you, again, for choosing me as your health care partner. I appreciate your trust and look forward to learning more about you.   Worthy Keeler, DNP, AGNP-c ___________________________________________________________  Health Maintenance Recommendations Screening Testing  Mammogram  Every 1 -2 years based on history and risk factors  Starting at age 37  Pap Smear  Ages 21-39 every 3 years  Ages 66-65 every 5 years with HPV testing  More frequent testing may be required based on results and history  Colon Cancer Screening  Every 1-10 years based on test performed, risk factors, and history  Starting at age 82  Bone Density Screening  Every 2-10 years based on history  Starting at age 10 for women  Recommendations for men differ based on medication usage, history, and  risk factors  AAA Screening  One time ultrasound  Men 75-25 years old who have every smoked  Lung Cancer Screening  Low Dose Lung CT every 12 months  Age 37-80 years with a 30 pack-year smoking history who still smoke or who have quit within the last 15 years  Screening Labs  Routine  Labs: Complete Blood Count (CBC), Complete Metabolic Panel (CMP), Cholesterol (Lipid Panel)  Every 6-12 months based on history and medications  May be recommended more frequently based on current conditions or previous results  Hemoglobin A1c Lab  Every 3-12 months based on history and previous results  Starting at age 19 or earlier with diagnosis of diabetes, high cholesterol, BMI >26, and/or risk factors  Frequent monitoring for patients with diabetes to ensure blood sugar control  Thyroid Panel (TSH w/ T3 & T4)  Every 6 months based on history, symptoms, and risk factors  May be repeated more often if on medication  HIV  One time testing for all patients 21 and older  May be repeated more frequently for patients with increased risk factors or exposure  Hepatitis C  One time testing for all patients 32 and older  May be repeated more frequently for patients with increased risk factors or exposure  Gonorrhea, Chlamydia  Every 12 months for all sexually active persons 13-24 years  Additional monitoring may be recommended for those who are considered high risk or who have symptoms  PSA  Men 69-31 years old with risk factors  Additional screening may be recommended from age 77-69 based on risk factors, symptoms, and history  Vaccine Recommendations  Tetanus Booster  All adults every 10 years  Flu Vaccine  All patients 6 months and older every year  COVID Vaccine  All patients 12 years and older  Initial dosing with booster  May recommend additional booster based on age and health history  HPV Vaccine  2 doses all patients age 69-26  Dosing may be considered  for patients over 26  Shingles Vaccine (Shingrix)  2 doses all adults 57 years and older  Pneumonia (Pneumovax 23)  All adults 28 years and older  May recommend earlier dosing based on health history  Pneumonia (Prevnar 33)  All adults 65 years and older  Dosed 1  year after Pneumovax 23  Additional Screening, Testing, and Vaccinations may be recommended on an individualized basis based on family history, health history, risk factors, and/or exposure.  __________________________________________________________  Diet Recommendations for All Patients  I recommend that all patients maintain a diet low in saturated fats, carbohydrates, and cholesterol. While this can be challenging at first, it is not impossible and small changes can make big differences.  Things to try: Marland Kitchen Decreasing the amount of soda, sweet tea, and/or juice to one or less per day and replace with water o While water is always the first choice, if you do not like water you may consider - adding a water additive without sugar to improve the taste - other sugar free drinks . Replace potatoes with a brightly colored vegetable at dinner . Use healthy oils, such as canola oil or olive oil, instead of butter or hard margarine . Limit your bread intake to two pieces or less a day . Replace regular pasta with low carb pasta options . Bake, broil, or grill foods instead of frying . Monitor portion sizes  . Eat smaller, more frequent meals throughout the day instead of large meals  An important thing to remember is, if you love foods that are not great for your health, you don't have to give them up completely. Instead, allow these foods to be a reward when you have done well. Allowing yourself to still have special treats every once in a while is a nice way to tell yourself thank you for working hard to keep yourself healthy.   Also remember that every day is a new day. If you have a bad day and "fall off the wagon", you  can still climb right back up and keep moving along on your journey!  We have resources available to help you!  Some websites that may be helpful include: . www.http://carter.biz/  . Www.VeryWellFit.com _____________________________________________________________  Activity Recommendations for All Patients  I recommend that all adults get at least 20 minutes of moderate physical activity that elevates your heart rate at least 5 days out of the week.  Some examples include: . Walking or jogging at a pace that allows you to carry on a conversation . Cycling (stationary bike or outdoors) . Water aerobics . Yoga . Weight lifting . Dancing If physical limitations prevent you from putting stress on your joints, exercise in a pool or seated in a chair are excellent options.  Do determine your MAXIMUM heart rate for activity: YOUR AGE - 220 = MAX HeartRate   Remember! . Do not push yourself too hard.  . Start slowly and build up your pace, speed, weight, time in exercise, etc.  . Allow your body to rest between exercise and get good sleep. . You will need more water than normal when you are exerting yourself. Do not wait until you are thirsty to drink. Drink with a purpose of getting in at least 8, 8 ounce glasses of water a day plus more depending on how much you exercise and sweat.    If you begin to develop dizziness, chest pain, abdominal pain, jaw pain, shortness of breath, headache, vision changes, lightheadedness, or other concerning symptoms, stop the activity and allow your body to rest. If your symptoms are severe, seek emergency evaluation immediately. If your symptoms are concerning, but not severe, please let us know so that we can recommend further evaluation.   ________________________________________________________________   Irritable Bowel Syndrome, Adult  Irritable bowel syndrome (IBS) is  a group of symptoms that affects the organs responsible for digestion (gastrointestinal  or GI tract). IBS is not one specific disease. To regulate how the GI tract works, the body sends signals back and forth between the intestines and the brain. If you have IBS, there may be a problem with these signals. As a result, the GI tract does not function normally. The intestines may become more sensitive and overreact to certain things. This may be especially true when you eat certain foods or when you are under stress. There are four types of IBS. These may be determined based on the consistency of your stool (feces):  IBS with diarrhea.  IBS with constipation.  Mixed IBS.  Unsubtyped IBS. It is important to know which type of IBS you have. Certain treatments are more likely to be helpful for certain types of IBS. What are the causes? The exact cause of IBS is not known. What increases the risk? You may have a higher risk for IBS if you:  Are female.  Are younger than 37.  Have a family history of IBS.  Have a mental health condition, such as depression, anxiety, or post-traumatic stress disorder.  Have had a bacterial infection of your GI tract. What are the signs or symptoms? Symptoms of IBS vary from person to person. The main symptom is abdominal pain or discomfort. Other symptoms usually include one or more of the following:  Diarrhea, constipation, or both.  Abdominal swelling or bloating.  Feeling full after eating a small or regular-sized meal.  Frequent gas.  Mucus in the stool.  A feeling of having more stool left after a bowel movement. Symptoms tend to come and go. They may be triggered by stress, mental health conditions, or certain foods. How is this diagnosed? This condition may be diagnosed based on a physical exam, your medical history, and your symptoms. You may have tests, such as:  Blood tests.  Stool test.  X-rays.  CT scan.  Colonoscopy. This is a procedure in which your GI tract is viewed with a long, thin, flexible tube. How is  this treated? There is no cure for IBS, but treatment can help relieve symptoms. Treatment depends on the type of IBS you have, and may include:  Changes to your diet, such as: ? Avoiding foods that cause symptoms. ? Drinking more water. ? Following a low-FODMAP (fermentable oligosaccharides, disaccharides, monosaccharides, and polyols) diet for up to 6 weeks, or as told by your health care provider. FODMAPs are sugars that are hard for some people to digest. ? Eating more fiber. ? Eating medium-sized meals at the same times every day.  Medicines. These may include: ? Fiber supplements, if you have constipation. ? Medicine to control diarrhea (antidiarrheal medicines). ? Medicine to help control muscle tightening (spasms) in your GI tract (antispasmodic medicines). ? Medicines to help with mental health conditions, such as antidepressants or tranquilizers.  Talk therapy or counseling.  Working with a diet and nutrition specialist (dietitian) to help create a food plan that is right for you.  Managing your stress. Follow these instructions at home: Eating and drinking  Eat a healthy diet.  Eat medium-sized meals at about the same time every day. Do not eat large meals.  Gradually eat more fiber-rich foods. These include whole grains, fruits, and vegetables. This may be especially helpful if you have IBS with constipation.  Eat a diet low in FODMAPs.  Drink enough fluid to keep your urine pale yellow.  Keep  a journal of foods that seem to trigger symptoms.  Avoid foods and drinks that: ? Contain added sugar. ? Make your symptoms worse. Dairy products, caffeinated drinks, and carbonated drinks can make symptoms worse for some people. General instructions  Take over-the-counter and prescription medicines and supplements only as told by your health care provider.  Get enough exercise. Do at least 150 minutes of moderate-intensity exercise each week.  Manage your stress.  Getting enough sleep and exercise can help you manage stress.  Keep all follow-up visits as told by your health care provider and therapist. This is important. Alcohol Use  Do not drink alcohol if: ? Your health care provider tells you not to drink. ? You are pregnant, may be pregnant, or are planning to become pregnant.  If you drink alcohol, limit how much you have: ? 0-1 drink a day for women. ? 0-2 drinks a day for men.  Be aware of how much alcohol is in your drink. In the [place] one drink equals one typical bottle of beer (12 oz), one-half glass of wine (5 oz), or one shot of hard liquor (1 oz). Contact a health care provider if you have:  Constant pain.  Weight loss.  Difficulty or pain when swallowing.  Diarrhea that gets worse. Get help right away if you have:  Severe abdominal pain.  Fever.  Diarrhea with symptoms of dehydration, such as dizziness or dry mouth.  Bright red blood in your stool.  Stool that is black and tarry.  Abdominal swelling.  Vomiting that does not stop.  Blood in your vomit. Summary  Irritable bowel syndrome (IBS) is not one specific disease. It is a group of symptoms that affects digestion.  Your intestines may become more sensitive and overreact to certain things. This may be especially true when you eat certain foods or when you are under stress.  There is no cure for IBS, but treatment can help relieve symptoms. This information is not intended to replace advice given to you by your health care provider. Make sure you discuss any questions you have with your health care provider. Document Revised: 07/30/2020 Document Reviewed: 07/30/2020 Elsevier Patient Education  2021 [place] stands for fermentable oligosaccharides, disaccharides, monosaccharides, and polyols. These are sugars that are hard for some people to digest. A low-FODMAP eating plan may help some people who have irritable bowel  syndrome (IBS) and certain other bowel (intestinal) diseases to manage their symptoms. This meal plan can be complicated to follow. Work with a diet and nutrition specialist (dietitian) to make a low-FODMAP eating plan that is right for you. A dietitian can help make sure that you get enough nutrition from this diet. What are tips for following this plan? Reading food labels  Check labels for hidden FODMAPs such as: ? High-fructose syrup. ? Honey. ? Agave. ? Natural fruit flavors. ? Onion or garlic powder.  Choose low-FODMAP foods that contain 3-4 grams of fiber per serving.  Check food labels for serving sizes. Eat only one serving at a time to make sure FODMAP levels stay low. Shopping  Shop with a list of foods that are recommended on this diet and make a meal plan. Meal planning  Follow a low-FODMAP eating plan for up to 6 weeks, or as told by your health care provider or dietitian.  To follow the eating plan: 1. Eliminate high-FODMAP foods from your diet completely. Choose only low-FODMAP foods to eat. You will do  this for 2-6 weeks. 2. Gradually reintroduce high-FODMAP foods into your diet one at a time. Most people should wait a few days before introducing the next new high-FODMAP food into their meal plan. Your dietitian can recommend how quickly you may reintroduce foods. 3. Keep a daily record of what and how much you eat and drink. Make note of any symptoms that you have after eating. 4. Review your daily record with a dietitian regularly to identify which foods you can eat and which foods you should avoid. General tips  Drink enough fluid each day to keep your urine pale yellow.  Avoid processed foods. These often have added sugar and may be high in FODMAPs.  Avoid most dairy products, whole grains, and sweeteners.  Work with a dietitian to make sure you get enough fiber in your diet.  Avoid high FODMAP foods at meals to manage symptoms. Recommended  foods Fruits Bananas, oranges, tangerines, lemons, limes, blueberries, raspberries, strawberries, grapes, cantaloupe, honeydew melon, kiwi, papaya, passion fruit, and pineapple. Limited amounts of dried cranberries, banana chips, and shredded coconut. Vegetables Eggplant, zucchini, cucumber, peppers, green beans, bean sprouts, lettuce, arugula, kale, Swiss chard, spinach, collard greens, bok choy, summer squash, potato, and tomato. Limited amounts of corn, carrot, and sweet potato. Green parts of scallions. Grains Gluten-free grains, such as rice, oats, buckwheat, quinoa, corn, polenta, and millet. Gluten-free pasta, bread, or cereal. Rice noodles. Corn tortillas. Meats and other proteins Unseasoned beef, pork, poultry, or fish. Eggs. Berniece Salines. Tofu (firm) and tempeh. Limited amounts of nuts and seeds, such as almonds, walnuts, [place] nuts, pecans, peanuts, nut butters, pumpkin seeds, chia seeds, and sunflower seeds. Dairy Lactose-free milk, yogurt, and kefir. Lactose-free cottage cheese and ice cream. Non-dairy milks, such as almond, coconut, hemp, and rice milk. Non-dairy yogurt. Limited amounts of goat cheese, brie, mozzarella, parmesan, swiss, and other hard cheeses. Fats and oils Butter-free spreads. Vegetable oils, such as olive, canola, and sunflower oil. Seasoning and other foods Artificial sweeteners with names that do not end in "ol," such as aspartame, saccharine, and stevia. Maple syrup, white table sugar, raw sugar, brown sugar, and molasses. Mayonnaise, soy sauce, and tamari. Fresh basil, coriander, parsley, rosemary, and thyme. Beverages Water and mineral water. Sugar-sweetened soft drinks. Small amounts of orange juice or cranberry juice. Black and green tea. Most dry wines. Coffee. The items listed above may not be a complete list of foods and beverages you can eat. Contact a dietitian for more information. Foods to avoid Fruits Fresh, dried, and juiced forms of apple, pear,  watermelon, peach, plum, cherries, apricots, blackberries, boysenberries, figs, nectarines, and mango. Avocado. Vegetables Chicory root, artichoke, asparagus, cabbage, snow peas, Brussels sprouts, broccoli, sugar snap peas, mushrooms, celery, and cauliflower. Onions, garlic, leeks, and the white part of scallions. Grains Wheat, including kamut, durum, and semolina. Barley and bulgur. Couscous. Wheat-based cereals. Wheat noodles, bread, crackers, and pastries. Meats and other proteins Fried or fatty meat. Sausage. Cashews and pistachios. Soybeans, baked beans, black beans, chickpeas, kidney beans, fava beans, navy beans, lentils, black-eyed peas, and split peas. Dairy Milk, yogurt, ice cream, and soft cheese. Cream and sour cream. Milk-based sauces. Custard. Buttermilk. Soy milk. Seasoning and other foods Any sugar-free gum or candy. Foods that contain artificial sweeteners such as sorbitol, mannitol, isomalt, or xylitol. Foods that contain honey, high-fructose corn syrup, or agave. Bouillon, vegetable stock, beef stock, and chicken stock. Garlic and onion powder. Condiments made with onion, such as hummus, chutney, pickles, relish, salad dressing, and salsa. Tomato paste.  Beverages Chicory-based drinks. Coffee substitutes. Chamomile tea. Fennel tea. Sweet or fortified wines such as port or sherry. Diet soft drinks made with isomalt, mannitol, maltitol, sorbitol, or xylitol. Apple, pear, and mango juice. Juices with high-fructose corn syrup. The items listed above may not be a complete list of foods and beverages you should avoid. Contact a dietitian for more information. Summary  FODMAP stands for fermentable oligosaccharides, disaccharides, monosaccharides, and polyols. These are sugars that are hard for some people to digest.  A low-FODMAP eating plan is a short-term diet that helps to ease symptoms of certain bowel diseases.  The eating plan usually lasts up to 6 weeks. After that, high-FODMAP  foods are reintroduced gradually and one at a time. This can help you find out which foods may be causing symptoms.  A low-FODMAP eating plan can be complicated. It is best to work with a dietitian who has experience with this type of plan. This information is not intended to replace advice given to you by your health care provider. Make sure you discuss any questions you have with your health care provider. Document Revised: 04/16/2020 Document Reviewed: 04/16/2020 Elsevier Patient Education  2021 [place].   Major Depressive Disorder, Adult Major depressive disorder is a mental health condition. This disorder affects feelings. It can also affect the body. Symptoms of this condition last most of the day, almost every day, for 2 weeks. This disorder can affect:  Relationships.  Daily activities, such as work and school.  Activities that you normally like to do. What are the causes? The cause of this condition is not known. The disorder is likely caused by a mix of things, including:  Your personality, such as being a shy person.  Your behavior, or how you act toward others.  Your thoughts and feelings.  Too much alcohol or drugs.  How you react to stress.  Health and mental problems that you have had for a long time.  Things that hurt you in the past (trauma).  Big changes in your life, such as divorce. What increases the risk? The following factors may make you more likely to develop this condition:  Having family members with depression.  Being a woman.  Problems in the family.  Low levels of some brain chemicals.  Things that caused you pain as a child, especially if you lost a parent or were abused.  A lot of stress in your life, such as from: ? Living without basic needs of life, such as food and shelter. ? Being treated poorly because of race, sex, or religion (discrimination).  Health and mental problems that you have had for a long time. What are the  signs or symptoms? The main symptoms of this condition are:  Being sad all the time.  Being grouchy all the time.  Loss of interest in things and activities. Other symptoms include:  Sleeping too much or too little.  Eating too much or too little.  Gaining or losing weight, without knowing why.  Feeling tired or having low energy.  Being restless and weak.  Feeling hopeless, worthless, or guilty.  Trouble thinking clearly or making decisions.  Thoughts of hurting yourself or others, or thoughts of ending your life.  Spending a lot of time alone.  Inability to complete common tasks of daily life. If you have very bad MDD, you may:  Believe things that are not true.  Hear, see, taste, or feel things that are not there.  Have mild depression that  lasts for at least 2 years.  Feel very sad and hopeless.  Have trouble speaking or moving. How is this treated? This condition may be treated with:  Talk therapy. This teaches you to know bad thoughts, feelings, and actions and how to change them. ? This can also help you to communicate with others. ? This can be done with members of your family.  Medicines. These can be used to treat worry (anxiety), depression, or low levels of chemicals in the brain.  Lifestyle changes. You may need to: ? Limit alcohol use. ? Limit drug use. ? Get regular exercise. ? Get plenty of sleep. ? Make healthy eating choices. ? Spend more time outdoors.  Brain stimulation. This treatment excites the brain. This is done when symptoms are very bad or have not gotten better with other treatments. Follow these instructions at home: Activity  Get regular exercise as told.  Spend time outdoors as told.  Make time to do the things you enjoy.  Find ways to deal with stress. Try to: ? Meditate. ? Do deep breathing. ? Spend time in nature. ? Keep a journal.  Return to your normal activities as told by your doctor. Ask your doctor what  activities are safe for you. Alcohol and drug use  If you drink alcohol: ? Limit how much you use to:  0-1 drink a day for women.  0-2 drinks a day for men. ? Be aware of how much alcohol is in your drink. In the [place] one drink equals one 12 oz bottle of beer (355 mL), one 5 oz glass of wine (148 mL), or one 1 oz glass of hard liquor (44 mL).  Talk to your doctor about: ? Alcohol use. Alcohol can affect some medicines. ? Any drug use. General instructions  Take over-the-counter and prescription medicines and herbal preparations only as told by your doctor.  Eat a healthy diet.  Get a lot of sleep.  Think about joining a support group. Your doctor may be able to suggest one.  Keep all follow-up visits as told by your doctor. This is important.   Where to find more information:  [employer] on Mental Illness: www.nami.[place]: https://carter.com/  American Psychiatric Association: www.psychiatry.org/patients-families/ Contact a doctor if:  Your symptoms get worse.  You get new symptoms. Get help right away if:  You hurt yourself.  You have serious thoughts about hurting yourself or others.  You see, hear, taste, smell, or feel things that are not there. If you ever feel like you may hurt yourself or others, or have thoughts about taking your own life, get help right away. Go to your nearest emergency department or:  Call your local emergency services (911 in the [place]  Call a suicide crisis helpline, such as the [facility] at (415) 530-0520. This is open 24 hours a day in the [place]  Text the Crisis Text Line at 6462923375 (in the [place].). Summary  Major depressive disorder is a mental health condition. This disorder affects feelings. Symptoms of this condition last most of the day, almost every day, for 2 weeks.  The symptoms of this disorder can cause problems with relationships and with daily  activities.  There are treatments and support for people who get this disorder. You may need more than one type of treatment.  Get help right away if you have serious thoughts about hurting yourself or others. This information is not intended to replace advice  given to you by your health care provider. Make sure you discuss any questions you have with your health care provider. Document Revised: 11/09/2019 Document Reviewed: 11/09/2019 Elsevier Patient Education  2021 [place].  http://NIMH.NIH.Gov">  Generalized Anxiety Disorder, Adult Generalized anxiety disorder (GAD) is a mental health condition. Unlike normal worries, anxiety related to GAD is not triggered by a specific event. These worries do not fade or get better with time. GAD interferes with relationships, work, and school. GAD symptoms can vary from mild to severe. People with severe GAD can have intense waves of anxiety with physical symptoms that are similar to panic attacks. What are the causes? The exact cause of GAD is not known, but the following are believed to have an impact:  Differences in natural brain chemicals.  Genes passed down from parents to children.  Differences in the way threats are perceived.  Development during childhood.  Personality. What increases the risk? The following factors may make you more likely to develop this condition:  Being female.  Having a family history of anxiety disorders.  Being very shy.  Experiencing very stressful life events, such as the death of a loved one.  Having a very stressful family environment. What are the signs or symptoms? People with GAD often worry excessively about many things in their lives, such as their health and family. Symptoms may also include:  Mental and emotional symptoms: ? Worrying excessively about natural disasters. ? Fear of being late. ? Difficulty concentrating. ? Fears that others are judging your performance.  Physical  symptoms: ? Fatigue. ? Headaches, muscle tension, muscle twitches, trembling, or feeling shaky. ? Feeling like your heart is pounding or beating very fast. ? Feeling out of breath or like you cannot take a deep breath. ? Having trouble falling asleep or staying asleep, or experiencing restlessness. ? Sweating. ? Nausea, diarrhea, or irritable bowel syndrome (IBS).  Behavioral symptoms: ? Experiencing erratic moods or irritability. ? Avoidance of new situations. ? Avoidance of people. ? Extreme difficulty making decisions. How is this diagnosed? This condition is diagnosed based on your symptoms and medical history. You will also have a physical exam. Your health care provider may perform tests to rule out other possible causes of your symptoms. To be diagnosed with GAD, a person must have anxiety that:  Is out of his or her control.  Affects several different aspects of his or her life, such as work and relationships.  Causes distress that makes him or her unable to take part in normal activities.  Includes at least three symptoms of GAD, such as restlessness, fatigue, trouble concentrating, irritability, muscle tension, or sleep problems. Before your health care provider can confirm a diagnosis of GAD, these symptoms must be present more days than they are not, and they must last for 6 months or longer. How is this treated? This condition may be treated with:  Medicine. Antidepressant medicine is usually prescribed for long-term daily control. Anti-anxiety medicines may be added in severe cases, especially when panic attacks occur.  Talk therapy (psychotherapy). Certain types of talk therapy can be helpful in treating GAD by providing support, education, and guidance. Options include: ? Cognitive behavioral therapy (CBT). People learn coping skills and self-calming techniques to ease their physical symptoms. They learn to identify unrealistic thoughts and behaviors and to replace  them with more appropriate thoughts and behaviors. ? Acceptance and commitment therapy (ACT). This treatment teaches people how to be mindful as a way to cope with unwanted  thoughts and feelings. ? Biofeedback. This process trains you to manage your body's response (physiological response) through breathing techniques and relaxation methods. You will work with a therapist while machines are used to monitor your physical symptoms.  Stress management techniques. These include yoga, meditation, and exercise. A mental health specialist can help determine which treatment is best for you. Some people see improvement with one type of therapy. However, other people require a combination of therapies.   Follow these instructions at home: Lifestyle  Maintain a consistent routine and schedule.  Anticipate stressful situations. Create a plan, and allow extra time to work with your plan.  Practice stress management or self-calming techniques that you have learned from your therapist or your health care provider. General instructions  Take over-the-counter and prescription medicines only as told by your health care provider.  Understand that you are likely to have setbacks. Accept this and be kind to yourself as you persist to take better care of yourself.  Recognize and accept your accomplishments, even if you judge them as small.  Keep all follow-up visits as told by your health care provider. This is important. Contact a health care provider if:  Your symptoms do not get better.  Your symptoms get worse.  You have signs of depression, such as: ? A persistently sad or irritable mood. ? Loss of enjoyment in activities that used to bring you joy. ? Change in weight or eating. ? Changes in sleeping habits. ? Avoiding friends or family members. ? Loss of energy for normal tasks. ? Feelings of guilt or worthlessness. Get help right away if:  You have serious thoughts about hurting yourself or  others. If you ever feel like you may hurt yourself or others, or have thoughts about taking your own life, get help right away. Go to your nearest emergency department or:  Call your local emergency services (911 in the [place]  Call a suicide crisis helpline, such as the [facility] at 469-419-1491. This is open 24 hours a day in the [place]  Text the Crisis Text Line at (734)493-3085 (in the [place].). Summary  Generalized anxiety disorder (GAD) is a mental health condition that involves worry that is not triggered by a specific event.  People with GAD often worry excessively about many things in their lives, such as their health and family.  GAD may cause symptoms such as restlessness, trouble concentrating, sleep problems, frequent sweating, nausea, diarrhea, headaches, and trembling or muscle twitching.  A mental health specialist can help determine which treatment is best for you. Some people see improvement with one type of therapy. However, other people require a combination of therapies. This information is not intended to replace advice given to you by your health care provider. Make sure you discuss any questions you have with your health care provider. Document Revised: 09/18/2019 Document Reviewed: 09/18/2019 Elsevier Patient Education  [place].

## 2021-05-07 NOTE — Assessment & Plan Note (Signed)
Chronic menstrual bleeding since placement of Nexplanon implant post partum Up to 6 pads per day No warning signs present, but history of anemia with pregnancy- will check this today No abdominal mass or irregularities in pelvic region assessed today- vaginal exam deferred at this time Progesterone challenge with 7-10 days of provera started today Consider combined OCP challenge if this is not effected for resetting menses- otherwise, will need to remove implant and seek a different form of contraception Pt agreeable to plan

## 2021-05-07 NOTE — Assessment & Plan Note (Signed)
Ongoing menses for the past year Heavy days with 6 pads per day many days Hx of anemia during pregnancy Will monitor blood levels today Starting progesterone challenge today to reset cycle- consider OCP challenge for 1-3 months if not effective Nexplanon implant removal may be required if ineffective at stopping bleeding.

## 2021-05-07 NOTE — Assessment & Plan Note (Signed)
PHQ-9 16 today No SI/HI/Self-harm present- no alarm signs Ongoing for about 7years- no medication or counseling in past Recommend counseling services and SSRI today Start sertraline taper to 50mg  over 7 days F/U in 2 weeks for re-evaluation

## 2021-05-07 NOTE — Assessment & Plan Note (Addendum)
BMI 15.84 today Pt reports 3 small meals per day with limitations on intake due to abdominal pain thought to be from IBS FODMAP diet plan provided for patient with recommendations for increased caloric intake through small meals and use of Bentyl for abdominal spasms.  Anxiety and Depression likely playing a role, as well- medication and counseling referral today No warning signs present for eating d/o F/U in 2 weeks

## 2021-05-07 NOTE — Assessment & Plan Note (Signed)
Shifting abdominal pain- improved with BM with no alarm sx present. Sx consistent with IBS in the setting of increased anxiety and depression Discussed etiology of IBS and dietary recommendation provided Will trial benyl before meals to see if this is helpful for cramping and pain Starting sertraline and referral for counseling for depression/anxiety F/U in 2 weeks or sooner if needed

## 2021-05-08 LAB — THYROID PANEL
Free Thyroxine Index: 1.3 (ref 1.2–4.9)
T3 Uptake Ratio: 25 % (ref 24–39)
T4, Total: 5.2 ug/dL (ref 4.5–12.0)

## 2021-05-12 NOTE — Progress Notes (Signed)
Please notify patient:  Thyroid panel normal. Blood sugars are normal. Kidney and liver function normal.  Calcium low- recommend increase calcium rich foods in diet (dairy, collard greens, salmon, kale) and vitamin with Calcium 1000mg  and Vitamin D3 800iU daily.  Protein levels low- recommend increase protein rich foods in diet (lean meats, seafood, dairy, beans) Anemia has resolved- levels look good. Recommend continue iron supplement to help keep up

## 2021-05-13 ENCOUNTER — Telehealth (HOSPITAL_BASED_OUTPATIENT_CLINIC_OR_DEPARTMENT_OTHER): Payer: Self-pay

## 2021-05-13 NOTE — Telephone Encounter (Signed)
Called patient to go over lab results and recommendations.  Could not leave a message because the mailbox is full.

## 2021-05-13 NOTE — Telephone Encounter (Signed)
-----   Message from Orma Render, NP sent at 05/12/2021 11:23 AM EDT ----- Please notify patient:  Thyroid panel normal. Blood sugars are normal. Kidney and liver function normal.  Calcium low- recommend increase calcium rich foods in diet (dairy, collard greens, salmon, kale) and vitamin with Calcium 1000mg  and Vitamin D3 800iU daily.  Protein levels low- recommend increase protein rich foods in diet (lean meats, seafood, dairy, beans) Anemia has resolved- levels look good. Recommend continue iron supplement to help keep up

## 2021-05-18 ENCOUNTER — Telehealth (HOSPITAL_BASED_OUTPATIENT_CLINIC_OR_DEPARTMENT_OTHER): Payer: Self-pay

## 2021-05-18 NOTE — Telephone Encounter (Signed)
-----   Message from Orma Render, NP sent at 05/12/2021 11:23 AM EDT ----- Please notify patient:  Thyroid panel normal. Blood sugars are normal. Kidney and liver function normal.  Calcium low- recommend increase calcium rich foods in diet (dairy, collard greens, salmon, kale) and vitamin with Calcium 1000mg  and Vitamin D3 800iU daily.  Protein levels low- recommend increase protein rich foods in diet (lean meats, seafood, dairy, beans) Anemia has resolved- levels look good. Recommend continue iron supplement to help keep up

## 2021-05-18 NOTE — Telephone Encounter (Signed)
Results released by Worthy Keeler, AGNP.  Called patient to discuss lab results and recommendation.  Patient agreed.  Instructed patient to contact the office with any questions or concerns.

## 2021-05-20 ENCOUNTER — Telehealth (INDEPENDENT_AMBULATORY_CARE_PROVIDER_SITE_OTHER): Payer: Medicaid Other | Admitting: Nurse Practitioner

## 2021-05-20 ENCOUNTER — Encounter (HOSPITAL_BASED_OUTPATIENT_CLINIC_OR_DEPARTMENT_OTHER): Payer: Self-pay | Admitting: Nurse Practitioner

## 2021-05-20 ENCOUNTER — Telehealth (HOSPITAL_BASED_OUTPATIENT_CLINIC_OR_DEPARTMENT_OTHER): Payer: Self-pay

## 2021-05-20 ENCOUNTER — Other Ambulatory Visit: Payer: Self-pay

## 2021-05-20 DIAGNOSIS — F322 Major depressive disorder, single episode, severe without psychotic features: Secondary | ICD-10-CM

## 2021-05-20 DIAGNOSIS — N926 Irregular menstruation, unspecified: Secondary | ICD-10-CM | POA: Diagnosis not present

## 2021-05-20 DIAGNOSIS — K581 Irritable bowel syndrome with constipation: Secondary | ICD-10-CM

## 2021-05-20 DIAGNOSIS — F411 Generalized anxiety disorder: Secondary | ICD-10-CM | POA: Diagnosis not present

## 2021-05-20 MED ORDER — SERTRALINE HCL 100 MG PO TABS: 100.0000 mg | ORAL_TABLET | Freq: Every day | ORAL | 3 refills | Status: DC

## 2021-05-20 MED ORDER — LACTULOSE 10 GM/15ML PO SOLN: ORAL | 0 refills | Status: DC

## 2021-05-20 MED ORDER — NORGESTIMATE-ETH ESTRADIOL 0.25-35 MG-MCG PO TABS: 1.0000 | ORAL_TABLET | Freq: Every day | ORAL | 3 refills | Status: DC

## 2021-05-20 NOTE — Progress Notes (Signed)
Virtual Video Visit via MyChart Note  I connected with  Roberta Alexander on 05/20/21 at 10:10 AM EDT by the video enabled telemedicine application for , MyChart, and verified that I am speaking with the correct person using two identifiers.   I introduced myself as a Designer, jewellery with the practice. We discussed the limitations of evaluation and management by telemedicine and the availability of in person appointments. The patient expressed understanding and agreed to proceed.  Participating parties in this visit include: The patient and the nurse practitioner listed.  The patient is: At home I am: In the office  Subjective:    CC:  Chief Complaint  Patient presents with   Follow-up    Mood and IBS Symptoms     HPI: Roberta Alexander is a 25 y.o. year old female presenting today via [place] today for follow-up for Mood and IBS.  Mood She has started sertraline and has been taking 50mg  dose for about 3 weeks. She reports she does not notice much improvement in her symptoms Endorses increased fatigue- staying in bed a lot. Anxiety and depression are ""about the same"  IBS Endorses still constipated. No longer taking her iron supplement or vitamin. She has increased her water intake and has been eating high fiber foods with no success.  She endorses frustration over this. Tried Bentyl, but this was not helpful for her stomach pain- still experiencing cramping.  Menses Still experiencing prolonged and heavy menses since Nexplanon placed Does not wish to have implant taken out at this time Completed progesterone challenge, with no change Interested in OCP with Nexplanon to see if this would be helpful.    Past medical history, Surgical history, Family history not pertinant except as noted below, Social history, Allergies, and medications have been entered into the medical record, reviewed, and corrections made.   Review of Systems:  All review of systems negative except what is  listed in the HPI   Objective:    General:  Speaking clearly in complete sentences. Absent shortness of breath noted.   Alert and oriented x3.   Normal judgment.  Absent acute distress.   Impression and Recommendations:    1. Irritable bowel syndrome with constipation - sertraline (ZOLOFT) 100 MG tablet; Take 1 tablet (100 mg total) by mouth daily.  Dispense: 30 tablet; Refill: 3 - lactulose (CHRONULAC) 10 GM/15ML solution; Take 20g (22mL) by mouth three times a day until soft stools begin, then may take as needed for constipation.  Dispense: 236 mL; Refill: 0  2. GAD (generalized anxiety disorder) - sertraline (ZOLOFT) 100 MG tablet; Take 1 tablet (100 mg total) by mouth daily.  Dispense: 30 tablet; Refill: 3  3. Depression, major, single episode, severe (HCC) - sertraline (ZOLOFT) 100 MG tablet; Take 1 tablet (100 mg total) by mouth daily.  Dispense: 30 tablet; Refill: 3  4. Irregular menstrual bleeding - norgestimate-ethinyl estradiol (ORTHO-CYCLEN, 28,) 0.25-35 MG-MCG tablet; Take 1 tablet by mouth daily.  Dispense: 84 tablet; Refill: 3  Recommend lactulose TID until BM is soft then use PRN when no BM for 2 or more days.  She may need to take this medication on a steady basis for a while. Consider GI referral if not helpful.  Stop Bentyl, since not helpful.   Increase sertraline to 100mg  per day to see if we get better control of anxiety and depression symptoms.  No SI/HI today and not worse than at previous visit, but no improvement noted.  If not effective,  will consider genetic testing or transition to new medication to see if it is helpful  Will start OCP with continuous use for regulation of menses- currently has Nexplanon- will continue for now.  Will re-evaluate after 3 month trial to see if symptoms have improved.    Follow-up if symptoms worsen or fail to improve.    I discussed the assessment and treatment plan with the patient. The patient was provided an  opportunity to ask questions and all were answered. The patient agreed with the plan and demonstrated an understanding of the instructions.   The patient was advised to call back or seek an in-person evaluation if the symptoms worsen or if the condition fails to improve as anticipated.  I provided 20 minutes of non-face-to-face interaction with this [place] visit including intake, same-day documentation, and chart review.   Roberta Render, NP

## 2021-05-20 NOTE — Patient Instructions (Signed)
Stop the dicyclomine since it is not helping with the cramping

## 2021-05-20 NOTE — Telephone Encounter (Signed)
Called patient to check her in for her virtual appointment.  Patient did not answer and left voicemail to call back.

## 2021-06-16 ENCOUNTER — Other Ambulatory Visit: Payer: Self-pay

## 2021-06-16 ENCOUNTER — Ambulatory Visit (HOSPITAL_COMMUNITY)
Admission: EM | Admit: 2021-06-16 | Discharge: 2021-06-16 | Disposition: A | Discharge status: Home / Self Care | Payer: Medicaid Other | Attending: Internal Medicine | Admitting: Internal Medicine

## 2021-06-16 ENCOUNTER — Encounter (HOSPITAL_COMMUNITY): Payer: Self-pay

## 2021-06-16 ENCOUNTER — Ambulatory Visit (INDEPENDENT_AMBULATORY_CARE_PROVIDER_SITE_OTHER): Payer: Medicaid Other

## 2021-06-16 DIAGNOSIS — M25532 Pain in left wrist: Secondary | ICD-10-CM

## 2021-06-16 MED ORDER — KETOROLAC TROMETHAMINE 10 MG PO TABS: 10.0000 mg | ORAL_TABLET | Freq: Three times a day (TID) | ORAL | 0 refills | Status: DC | PRN

## 2021-06-16 NOTE — ED Triage Notes (Signed)
Pt states she doesn't know what happened but she is having pain in her left wrist, radiating to fingers. Pt is not able to move wrist, skin warm, radial pulse +2  Interventions: ice pack- no help  Started: 2-3 days ago

## 2021-06-16 NOTE — Discharge Instructions (Addendum)
-  Start Toradol for pain, up to every 8 hours as needed.  Take this with food.  Avoid other NSAIDs while this medication like ibuprofen.  You can still take Tylenol for additional relief.  Also avoid alcohol while taking this medication. -Wrist brace as needed for pain, use this throughout the day to provide yourself with support. -Follow-up with [place] or primary care if symptoms persist.

## 2021-06-16 NOTE — ED Provider Notes (Signed)
[place]    CSN: [id number] Arrival date & time: 06/16/21  1940      History   Chief Complaint Chief Complaint  Patient presents with   Wrist Pain    HPI AHLIA LEMANSKI is a 25 y.o. female presenting with nontraumatic left wrist pain for 2 days.  Medical history recent pregnancy, though she states she is not currently pregnant or breast-feeding.  Endorses pain over the dorsal aspect of her wrist radiating towards her third finger.  Pain is worse with moving of the wrist.  Denies sensation changes.  Denies recent or distant trauma.  Denies pain elsewhere.  Feeling well otherwise.  HPI  Past Medical History:  Diagnosis Date   Anemia    Anemia during pregnancy in second trimester 01/09/2020   IUGR (intrauterine growth restriction) affecting care of mother 04/22/2020   Placenta previa antepartum 12/12/2019   Supervision of other normal pregnancy, antepartum 11/06/2019   .Marland Kitchen Nursing Staff Provider Office Location  [facility] Dating  LMP Language  English Anatomy US   Flu Vaccine  Declined  Genetic Screen  NIPS:   AFP:   First Screen:  Quad:   TDaP vaccine   gave info 04-16-20 Hgb A1C or  GTT Early  Third trimester: 1 hr WNL Rhogam     LAB RESULTS  Feeding Plan Breast/Bottle Blood Type B/Positive/-- (12/01 1021)  Contraception Nexplanon Antibody Negative (12/01 1021) Circ    Patient Active Problem List   Diagnosis Date Noted   Screening for endocrine, nutritional, metabolic and immunity disorder 05/07/2021   Iron deficiency anemia due to chronic blood loss 05/07/2021   Depression, major, single episode, severe ([place]) 05/07/2021   GAD (generalized anxiety disorder) 05/07/2021   Irritable bowel syndrome with constipation 05/07/2021   Encounter to establish care 05/07/2021   Irregular menstrual bleeding 05/07/2021   Mildly underweight adult 11/12/2019    Past Surgical History:  Procedure Laterality Date   INDUCED ABORTION      OB History     Gravida  3   Para  2   Term   2   Preterm  0   AB  1   Living  2      SAB  0   IAB  0   Ectopic  0   Multiple      Live Births  2            Home Medications    Prior to Admission medications   Medication Sig Start Date End Date Taking? Authorizing Provider  ketorolac (TORADOL) 10 MG tablet Take 1 tablet (10 mg total) by mouth every 8 (eight) hours as needed. 06/16/21  Yes Hazel Sams, PA-C  lactulose (CHRONULAC) 10 GM/15ML solution Take 20g (22mL) by mouth three times a day until soft stools begin, then may take as needed for constipation. 05/20/21   Orma Render, NP  norgestimate-ethinyl estradiol (ORTHO-CYCLEN, 28,) 0.25-35 MG-MCG tablet Take 1 tablet by mouth daily. 05/20/21   Orma Render, NP  sertraline (ZOLOFT) 100 MG tablet Take 1 tablet (100 mg total) by mouth daily. 05/20/21   Orma Render, NP    Family History No family history on file.  Social History Social History   Tobacco Use   Smoking status: Every Day    Pack years: 0.00    Types: Cigarettes    Last attempt to quit: 12/17/2018    Years since quitting: 2.4   Smokeless tobacco: Never   Tobacco comments:  5 cigarettes per day  Vaping Use   Vaping Use: Never used  Substance Use Topics   Alcohol use: Yes   Drug use: Not Currently     Allergies   Patient has no known allergies.   Review of Systems Review of Systems  Musculoskeletal:        L wrist pain  All other systems reviewed and are negative.   Physical Exam Triage Vital Signs ED Triage Vitals  Enc Vitals Group     BP 06/16/21 1951 111/76     Pulse Rate 06/16/21 1951 82     Resp 06/16/21 1951 16     Temp 06/16/21 1951 98.6 F (37 C)     Temp Source 06/16/21 1951 Oral     SpO2 06/16/21 1951 98 %     Weight --      Height --      Head Circumference --      Peak Flow --      Pain Score 06/16/21 1954 8     Pain Loc --      Pain Edu? --      Excl. in [place]? --    No data found.  Updated Vital Signs BP 111/76 (BP Location: Right Arm)   Pulse 82    Temp 98.6 F (37 C) (Oral)   Resp 16   LMP 05/29/2021   SpO2 98%   Visual Acuity Right Eye Distance:   Left Eye Distance:   Bilateral Distance:    Right Eye Near:   Left Eye Near:    Bilateral Near:     Physical Exam Vitals reviewed.  Constitutional:      General: She is not in acute distress.    Appearance: Normal appearance. She is not ill-appearing or diaphoretic.  HENT:     Head: Normocephalic and atraumatic.  Cardiovascular:     Rate and Rhythm: Normal rate and regular rhythm.     Heart sounds: Normal heart sounds.  Pulmonary:     Effort: Pulmonary effort is normal.     Breath sounds: Normal breath sounds.  Musculoskeletal:     Comments: L wrist TTP to dorsal aspect, without obvious deformity or effusion or ecchymosis. ROM and grip strength limited due to pain. Sensation intact, radial pulse 2+, cap refill <2 seconds. No snuffbox tenderness.  Skin:    General: Skin is warm.  Neurological:     General: No focal deficit present.     Mental Status: She is alert and oriented to person, place, and time.  Psychiatric:        Mood and Affect: Mood normal.        Behavior: Behavior normal.        Thought Content: Thought content normal.        Judgment: Judgment normal.     UC Treatments / Results  Labs (all labs ordered are listed, but only abnormal results are displayed) Labs Reviewed - No data to display  EKG   Radiology DG Wrist Complete Left  Result Date: 06/16/2021 CLINICAL DATA:  Wrist pain EXAM: LEFT WRIST - COMPLETE 3+ VIEW COMPARISON:  None. FINDINGS: There is no evidence of fracture or dislocation. There is no evidence of arthropathy or other focal bone abnormality. Soft tissues are unremarkable. IMPRESSION: Negative. Electronically Signed   By: Donavan Foil M.D.   On: 06/16/2021 20:09    Procedures Procedures (including critical care time)  Medications Ordered in UC Medications - No data to display  Initial Impression /  Assessment and Plan / UC  Course  I have reviewed the triage vital signs and the nursing notes.  Pertinent labs & imaging results that were available during my care of the patient were reviewed by me and considered in my medical decision making (see chart for details).     This patient is a very pleasant 25 y.o. year old female presenting with nontraumatic L wrist pain. Neurovascularly intact. She does engage in repetitive motions- lifting infant at home. Suspect she could be developing a ganlion cyst. Xray L wrist- negative. States she is not pregnant or breastfeeding. Cr normal range 04/2021.  Toradol prn as below, wrist brace provided.  ED return precautions discussed. Patient verbalizes understanding and agreement.    Final Clinical Impressions(s) / UC Diagnoses   Final diagnoses:  Left wrist pain     Discharge Instructions      -Start Toradol for pain, up to every 8 hours as needed.  Take this with food.  Avoid other NSAIDs while this medication like ibuprofen.  You can still take Tylenol for additional relief.  Also avoid alcohol while taking this medication. -Wrist brace as needed for pain, use this throughout the day to provide yourself with support. -Follow-up with [place] or primary care if symptoms persist.     ED Prescriptions     Medication Sig Dispense Auth. Provider   ketorolac (TORADOL) 10 MG tablet Take 1 tablet (10 mg total) by mouth every 8 (eight) hours as needed. 14 tablet Hazel Sams, PA-C      PDMP not reviewed this encounter.   Hazel Sams, PA-C 06/16/21 2058

## 2021-07-02 ENCOUNTER — Ambulatory Visit (HOSPITAL_BASED_OUTPATIENT_CLINIC_OR_DEPARTMENT_OTHER): Payer: Medicaid Other | Admitting: Nurse Practitioner

## 2021-07-05 ENCOUNTER — Encounter (HOSPITAL_BASED_OUTPATIENT_CLINIC_OR_DEPARTMENT_OTHER): Payer: Self-pay | Admitting: Nurse Practitioner

## 2021-09-27 DIAGNOSIS — L02413 Cutaneous abscess of right upper limb: Secondary | ICD-10-CM | POA: Diagnosis not present

## 2022-05-01 ENCOUNTER — Encounter (HOSPITAL_COMMUNITY): Payer: Self-pay | Admitting: Emergency Medicine

## 2022-05-01 ENCOUNTER — Ambulatory Visit (HOSPITAL_COMMUNITY)
Admission: EM | Admit: 2022-05-01 | Discharge: 2022-05-01 | Disposition: A | Discharge status: Home / Self Care | Payer: Medicaid Other | Attending: Family Medicine | Admitting: Family Medicine

## 2022-05-01 ENCOUNTER — Other Ambulatory Visit: Payer: Self-pay

## 2022-05-01 DIAGNOSIS — R1012 Left upper quadrant pain: Secondary | ICD-10-CM | POA: Insufficient documentation

## 2022-05-01 LAB — COMPREHENSIVE METABOLIC PANEL
ALT: 13 U/L (ref 0–44)
AST: 23 U/L (ref 15–41)
Albumin: 3.8 g/dL (ref 3.5–5.0)
Alkaline Phosphatase: 53 U/L (ref 38–126)
Anion gap: 7 (ref 5–15)
BUN: 10 mg/dL (ref 6–20)
CO2: 25 mmol/L (ref 22–32)
Calcium: 8.9 mg/dL (ref 8.9–10.3)
Chloride: 110 mmol/L (ref 98–111)
Creatinine, Ser: 0.72 mg/dL (ref 0.44–1.00)
GFR, Estimated: >60 mL/min (ref >60)
Glucose, Bld: 89 mg/dL (ref 70–99)
Potassium: 3.9 mmol/L (ref 3.5–5.1)
Sodium: 142 mmol/L (ref 135–145)
Total Bilirubin: 0.6 mg/dL (ref 0.3–1.2)
Total Protein: 6.5 g/dL (ref 6.5–8.1)

## 2022-05-01 LAB — POCT URINALYSIS DIPSTICK, ED / UC
Glucose, UA: NEGATIVE mg/dL
Ketones, ur: NEGATIVE mg/dL
Nitrite: NEGATIVE
Protein, ur: 30 mg/dL — AB
Specific Gravity, Urine: >=1.03 (ref 1.005–1.030)
Urobilinogen, UA: 1 mg/dL (ref 0.0–1.0)
pH: 6 (ref 5.0–8.0)

## 2022-05-01 LAB — POC URINE PREG, ED: Preg Test, Ur: NEGATIVE

## 2022-05-01 LAB — CBC
HCT: 40.3 % (ref 36.0–46.0)
Hemoglobin: 13.8 g/dL (ref 12.0–15.0)
MCH: 33.7 pg (ref 26.0–34.0)
MCHC: 34.2 g/dL (ref 30.0–36.0)
MCV: 98.3 fL (ref 80.0–100.0)
Platelets: 219 10*3/uL (ref 150–400)
RBC: 4.1 MIL/uL (ref 3.87–5.11)
RDW: 11.5 % (ref 11.5–15.5)
WBC: 11.6 10*3/uL — ABNORMAL HIGH (ref 4.0–10.5)
nRBC: 0 % (ref 0.0–0.2)

## 2022-05-01 MED ORDER — OMEPRAZOLE 40 MG PO CPDR: 40.0000 mg | DELAYED_RELEASE_CAPSULE | Freq: Every day | ORAL | 2 refills | Status: DC

## 2022-05-01 MED ORDER — ONDANSETRON 4 MG PO TBDP: 4.0000 mg | ORAL_TABLET | Freq: Three times a day (TID) | ORAL | 0 refills | Status: DC | PRN

## 2022-05-01 NOTE — Discharge Instructions (Addendum)
Urinalysis showed some white blood cells and red blood cells  Pregnancy test is negative  Take omeprazole 40 mg--1 capsule daily for stomach acid  Ondansetron dissolved in the mouth every 8 hours as needed for nausea or vomiting. [name] things to eat

## 2022-05-01 NOTE — ED Triage Notes (Addendum)
Left upper abdominal pain since Thursday.  Patient has had nausea and vomiting, no diarrhea.  No vomiting today.     Patient has a nexplanon for 2 years, but reports vaginal bleeding since February.

## 2022-05-01 NOTE — ED Provider Notes (Signed)
Roberta Alexander    CSN: [id number] Arrival date & time: 05/01/22  1145      History   Chief Complaint Chief Complaint  Patient presents with   Abdominal Pain    HPI Roberta Alexander is a 26 y.o. female.    Abdominal Pain Here for left upper quadrant pain that is been going on for years.  Apparently has also had some intermittent nausea and vomiting.  Appetite is decreased for an unspecified period of time.  Pain on swallowing.  Current weight is 98.  She states that she did weight 89 after delivering her child 7 years ago.  No pain on urination.  Urinating might actually make her feel better.  Has a Nexplanon and currently.  It was placed in the hospital in February 23.  She has had vaginal bleeding since.  Past Medical History:  Diagnosis Date   Anemia    Anemia during pregnancy in second trimester 01/09/2020   IUGR (intrauterine growth restriction) affecting care of mother 04/22/2020   Placenta previa antepartum 12/12/2019   Supervision of other normal pregnancy, antepartum 11/06/2019   .Marland Kitchen Nursing Staff Provider Office Location  [facility] Dating  LMP Language  English Anatomy US   Flu Vaccine  Declined  Genetic Screen  NIPS:   AFP:   First Screen:  Quad:   TDaP vaccine   gave info 04-16-20 Hgb A1C or  GTT Early  Third trimester: 1 hr WNL Rhogam     LAB RESULTS  Feeding Plan Breast/Bottle Blood Type B/Positive/-- (12/01 1021)  Contraception Nexplanon Antibody Negative (12/01 1021) Circ    Patient Active Problem List   Diagnosis Date Noted   Screening for endocrine, nutritional, metabolic and immunity disorder 05/07/2021   Iron deficiency anemia due to chronic blood loss 05/07/2021   Depression, major, single episode, severe ([place]) 05/07/2021   GAD (generalized anxiety disorder) 05/07/2021   Irritable bowel syndrome with constipation 05/07/2021   Encounter to establish care 05/07/2021   Irregular menstrual bleeding 05/07/2021   Mildly underweight adult 11/12/2019    Past  Surgical History:  Procedure Laterality Date   INDUCED ABORTION      OB History     Gravida  3   Para  2   Term  2   Preterm  0   AB  1   Living  2      SAB  0   IAB  0   Ectopic  0   Multiple      Live Births  2            Home Medications    Prior to Admission medications   Medication Sig Start Date End Date Taking? Authorizing Provider  omeprazole (PRILOSEC) 40 MG capsule Take 1 capsule (40 mg total) by mouth daily. 05/01/22  Yes Barrett Henle, MD  ondansetron (ZOFRAN-ODT) 4 MG disintegrating tablet Take 1 tablet (4 mg total) by mouth every 8 (eight) hours as needed for nausea or vomiting. 05/01/22  Yes Tajia Szeliga, Gwenlyn Perking, MD  lactulose (CHRONULAC) 10 GM/15ML solution Take 20g (72m) by mouth three times a day until soft stools begin, then may take as needed for constipation. 05/20/21   Early, SCoralee Pesa NP    Family History Family History  Problem Relation Age of Onset   Healthy Mother    Healthy Father     Social History Social History   Tobacco Use   Smoking status: Every Day    Types: Cigarettes  Last attempt to quit: 12/17/2018    Years since quitting: 3.3   Smokeless tobacco: Never   Tobacco comments:    5 cigarettes per day  Vaping Use   Vaping Use: Never used  Substance Use Topics   Alcohol use: Yes   Drug use: Not Currently     Allergies   Patient has no known allergies.   Review of Systems Review of Systems  Gastrointestinal:  Positive for abdominal pain.    Physical Exam Triage Vital Signs ED Triage Vitals  Enc Vitals Group     BP 05/01/22 1249 114/80     Pulse Rate 05/01/22 1249 74     Resp 05/01/22 1249 18     Temp 05/01/22 1249 98.2 F (36.8 C)     Temp Source 05/01/22 1249 Oral     SpO2 05/01/22 1249 100 %     Weight 05/01/22 1251 98 lb (44.5 kg)     Height --      Head Circumference --      Peak Flow --      Pain Score 05/01/22 1244 6     Pain Loc --      Pain Edu? --      Excl. in [place]? --    No data  found.  Updated Vital Signs BP 114/80 (BP Location: Right Arm)   Pulse 74   Temp 98.2 F (36.8 C) (Oral)   Resp 18   Wt 44.5 kg   LMP  (LMP Unknown)   SpO2 100%   BMI 16.31 kg/m   Visual Acuity Right Eye Distance:   Left Eye Distance:   Bilateral Distance:    Right Eye Near:   Left Eye Near:    Bilateral Near:     Physical Exam Vitals reviewed.  Constitutional:      General: She is not in acute distress.    Appearance: She is not ill-appearing, toxic-appearing or diaphoretic.     Comments: Thin  HENT:     Nose: Nose normal.     Mouth/Throat:     Mouth: Mucous membranes are moist.     Pharynx: No oropharyngeal exudate or posterior oropharyngeal erythema.  Eyes:     Extraocular Movements: Extraocular movements intact.     Pupils: Pupils are equal, round, and reactive to light.  Cardiovascular:     Rate and Rhythm: Normal rate and regular rhythm.     Heart sounds: No murmur heard. Pulmonary:     Effort: Pulmonary effort is normal.     Breath sounds: Normal breath sounds. No stridor. No wheezing, rhonchi or rales.  Chest:     Chest wall: No tenderness.  Abdominal:     Palpations: Abdomen is soft. There is no mass.     Tenderness: There is abdominal tenderness (Pronounced tenderness in her left upper quadrant and epigastric). There is no guarding.  Musculoskeletal:     Cervical back: Neck supple.  Lymphadenopathy:     Cervical: No cervical adenopathy.  Skin:    Capillary Refill: Capillary refill takes less than 2 seconds.     Coloration: Skin is not jaundiced or pale.  Neurological:     General: No focal deficit present.     Mental Status: She is alert and oriented to person, place, and time.     UC Treatments / Results  Labs (all labs ordered are listed, but only abnormal results are displayed) Labs Reviewed  POCT URINALYSIS DIPSTICK, ED / UC - Abnormal; Notable for the  following components:      Result Value   Bilirubin Urine SMALL (*)    Hgb urine  dipstick LARGE (*)    Protein, ur 30 (*)    Leukocytes,Ua TRACE (*)    All other components within normal limits  URINE CULTURE  CBC  COMPREHENSIVE METABOLIC PANEL  POC URINE PREG, ED    EKG   Radiology No results found.  Procedures Procedures (including critical care time)  Medications Ordered in UC Medications - No data to display  Initial Impression / Assessment and Plan / UC Course  I have reviewed the triage vital signs and the nursing notes.  Pertinent labs & imaging results that were available during my care of the patient were reviewed by me and considered in my medical decision making (see chart for details).    Urinalysis shows some blood and white blood cells.  UPT is negative We will do a blood count and a CMP today.  I am going to send in Prilosec for possible gastritis and Zofran to use as needed.  Request made to help her find a PCP. Final Clinical Impressions(s) / UC Diagnoses   Final diagnoses:  LUQ abdominal pain     Discharge Instructions      Urinalysis showed some white blood cells and red blood cells  Pregnancy test is negative  Take omeprazole 40 mg--1 capsule daily for stomach acid  Ondansetron dissolved in the mouth every 8 hours as needed for nausea or vomiting. [place] things to eat     ED Prescriptions     Medication Sig Dispense Auth. Provider   omeprazole (PRILOSEC) 40 MG capsule Take 1 capsule (40 mg total) by mouth daily. 30 capsule Barrett Henle, MD   ondansetron (ZOFRAN-ODT) 4 MG disintegrating tablet Take 1 tablet (4 mg total) by mouth every 8 (eight) hours as needed for nausea or vomiting. 10 tablet Windy Carina Gwenlyn Perking, MD      PDMP not reviewed this encounter.   Barrett Henle, MD 05/01/22 1321

## 2022-05-01 NOTE — ED Notes (Signed)
Sent to bathroom 

## 2022-05-02 LAB — URINE CULTURE: Culture: <10000 — AB

## 2022-05-20 ENCOUNTER — Ambulatory Visit (HOSPITAL_BASED_OUTPATIENT_CLINIC_OR_DEPARTMENT_OTHER): Payer: Medicaid Other | Admitting: Nurse Practitioner

## 2022-05-24 ENCOUNTER — Encounter (HOSPITAL_BASED_OUTPATIENT_CLINIC_OR_DEPARTMENT_OTHER): Payer: Self-pay

## 2022-05-24 ENCOUNTER — Encounter (HOSPITAL_BASED_OUTPATIENT_CLINIC_OR_DEPARTMENT_OTHER): Payer: Self-pay | Admitting: Nurse Practitioner

## 2022-05-24 ENCOUNTER — Ambulatory Visit (HOSPITAL_BASED_OUTPATIENT_CLINIC_OR_DEPARTMENT_OTHER): Payer: Medicaid Other | Admitting: Nurse Practitioner

## 2022-05-24 VITALS — BP 122/82 | HR 81 | Ht 66.0 in | Wt 93.0 lb

## 2022-05-24 DIAGNOSIS — F339 Major depressive disorder, recurrent, unspecified: Secondary | ICD-10-CM

## 2022-05-24 DIAGNOSIS — N926 Irregular menstruation, unspecified: Secondary | ICD-10-CM

## 2022-05-24 DIAGNOSIS — F320 Major depressive disorder, single episode, mild: Secondary | ICD-10-CM

## 2022-05-24 DIAGNOSIS — R1033 Periumbilical pain: Secondary | ICD-10-CM | POA: Diagnosis not present

## 2022-05-24 DIAGNOSIS — K639 Disease of intestine, unspecified: Secondary | ICD-10-CM

## 2022-05-24 DIAGNOSIS — R634 Abnormal weight loss: Secondary | ICD-10-CM

## 2022-05-24 MED ORDER — SERTRALINE HCL 50 MG PO TABS: 50.0000 mg | ORAL_TABLET | Freq: Every day | ORAL | 3 refills | Status: AC

## 2022-05-24 NOTE — Telephone Encounter (Signed)
Pt called back and was scheduled for procedure for 06/09/22.

## 2022-05-24 NOTE — Progress Notes (Signed)
Worthy Keeler, DNP, AGNP-c [place] [address] [place], [place] [address] [phone] Office [phone] Fax  ESTABLISHED PATIENT- Chronic Health and/or Follow-Up Visit  Blood pressure 122/82, pulse 81, height '5\' 6"'$  (1.676 m), weight 93 lb (42.2 kg), SpO2 98 %, unknown if currently breastfeeding.- not breast feeding  Follow-up (Stomach pain, unable to eat. Noting helps. Pain level 10/She's had menstrual cycle since February)   HPI  Roberta Alexander  is a 26 y.o. year old female presenting today for evaluation and management of the following: Abdominal Pain Ongoing for several years  After delivery of child in 2015 symptoms started Initially thought it was her IUD and she had this taken out after 3 years  No appetite, does not feel hungry Tired all the time Waking up with night sweats  No fevers, chills Weakness and fatigue present Only thing that makes the abdominal pain better is laying down 10/10 at it's worst Significant weight loss/difficulty gaining weight Continuous bleeding/menses since February   ROS All ROS negative with exception of what is listed in HPI  PHYSICAL EXAM Physical Exam Vitals and nursing note reviewed.  Constitutional:      Appearance: She is well-groomed and underweight. She is not ill-appearing.  HENT:     Head: Normocephalic.  Eyes:     Extraocular Movements: Extraocular movements intact.     Pupils: Pupils are equal, round, and reactive to light.  Cardiovascular:     Rate and Rhythm: Normal rate and regular rhythm.     Pulses: Normal pulses.     Heart sounds: Normal heart sounds.  Pulmonary:     Effort: Pulmonary effort is normal.     Breath sounds: Normal breath sounds.  Abdominal:     General: Bowel sounds are normal. There is no distension or abdominal bruit.     Palpations: Abdomen is soft. There is no shifting dullness, hepatomegaly, splenomegaly or mass.     Tenderness: There  is abdominal tenderness in the epigastric area, periumbilical area and left upper quadrant. There is no right CVA tenderness, left CVA tenderness, guarding or rebound.     Hernia: No hernia is present.  Musculoskeletal:        General: Normal range of motion.     Cervical back: Normal range of motion and neck supple.     Right lower leg: No edema.     Left lower leg: No edema.  Lymphadenopathy:     Cervical: No cervical adenopathy.  Skin:    General: Skin is warm and dry.     Capillary Refill: Capillary refill takes less than 2 seconds.  Neurological:     General: No focal deficit present.     Mental Status: She is alert and oriented to person, place, and time.     Motor: Weakness present.     Comments: Equal weakness against resistance with upper and lower extremities bilaterally.   Psychiatric:        Mood and Affect: Mood normal.        Behavior: Behavior normal.        Thought Content: Thought content normal.        Judgment: Judgment normal.     ASSESSMENT & PLAN Problem List Items Addressed This Visit     Irregular menstrual bleeding    Irregular, prolonged menstrual bleeding since nexplanon placement in February. Suspect decreased body fat and implant triggering the irregular bleeding. Plan to work to find cause of GI  disturbance and increase PO intake. Patient would like to have Nexplanon removed, which I feel is reasonable given the symptoms she is experiencing. Will plan for removal in the future as patients schedule allows.       Relevant Medications   sertraline (ZOLOFT) 50 MG tablet   Other Relevant Orders   Iron, TIBC and Ferritin Panel (Completed)   Thyroid Panel With TSH   CBC with Differential/Platelet (Completed)   Comprehensive metabolic panel (Completed)   Insulin, Free and Total (Completed)   Ambulatory referral to Gastroenterology   Weight loss, unintentional - Primary    Chronic weight loss for the last several years in the setting of severe abdominal  pain and lack of appetite. At this time it is unclear what is causing symptoms. She has been started on PPI medication by a different provider, but is not currently taking this. Plan for labs and imaging. GI referral for further evaluation. Will make changes to plan of care as necessary based on lab/imaging findings.       Relevant Medications   sertraline (ZOLOFT) 50 MG tablet   Other Relevant Orders   Iron, TIBC and Ferritin Panel (Completed)   Thyroid Panel With TSH   CBC with Differential/Platelet (Completed)   Comprehensive metabolic panel (Completed)   Insulin, Free and Total (Completed)   Ambulatory referral to Gastroenterology   Depression, recurrent ([place])    Depression symptoms remain present, unclear if this could be contributing to her lack of appetite and weight loss. Recommend restart of sertraline to help with mood. She is agreeable to this today. Will send script to pharmacy.       Relevant Medications   sertraline (ZOLOFT) 50 MG tablet   Periumbilical abdominal pain    Chronic. Etiology unclear at this time. Mild tenderness on palpation with no abnormalities palpable. Patient has had extensive lab work-up for this concern in the past. Unclear if her underlying anxiety/depression could be exacerbating this or possible IBS symptom. Given the length of time symptoms have been present and the concern over weight loss and associated symptoms, I do recommend an abdominal CT for further evaluation. Will also send GI referral. Labs today for further evaluation.       Relevant Orders   CT Abdomen Pelvis W Contrast (Completed)   Ambulatory referral to Gastroenterology   Bowel wall thickening    Observation on CT. Significance unclear. Referral to GI placed.       Relevant Orders   Ambulatory referral to Gastroenterology     FOLLOW-UP Return for next few weeks for removal of nexplanon.  Worthy Keeler, DNP, AGNP-c

## 2022-05-24 NOTE — Patient Instructions (Signed)
It was a pleasure seeing you today. I hope your time spent with us was pleasant and helpful. Please let us know if there is anything we can do to improve the service you receive.  ? ? ? ? ? ? ? ?Important Office Information ?Lab Results ?If labs were ordered, please note that you will see results through MyChart as soon as they come available from [facility].  ?It takes up to 5 business days for the results to be routed to me and for me to review them once all of the lab results have come through from [facility]. I will make recommendations based on your results and send these through MyChart or someone from the office will call you to discuss. If your labs are abnormal, we may contact you to schedule a visit to discuss the results and make recommendations.  ?If you have not heard from us within 5 business days or you have waited longer than a week and your lab results have not come through on MyChart, please feel free to call the office or send a message through MyChart to follow-up on these labs.  ? ?Referrals ?If referrals were placed today, the office where the referral was sent will contact you either by phone or through MyChart to set up scheduling. Please note that it can take up to a week for the referral office to contact you. If you do not hear from them in a week, please contact the referral office directly to inquire about scheduling.  ? ?Condition Treated ?If your condition worsens or you begin to have new symptoms, please schedule a follow-up appointment for further evaluation. If you are not sure if an appointment is needed, you may call the office to leave a message for the nurse and someone will contact you with recommendations.  ?If you have an urgent or life threatening emergency, please do not call the office, but seek emergency evaluation by calling 911 or going to the nearest emergency room for evaluation.  ? ?MyChart and Phone Calls ?Please do not use MyChart for urgent messages. It may take up to 3  business days for MyChart messages to be read by staff and if they are unable to handle the request, an additional 3 business days for them to be routed to me and for my response.  ?Messages sent to the provider through MyChart do not come directly to the provider, please allow time for these messages to be routed and for me to respond.  ?We get a large volume of MyChart messages daily and these are responded to in the order received.  ? ?For urgent messages, please call the office at [phone] and speak with the front office staff or leave a message on the line of my assistant for guidance.  ?We are seeing patients from the hours of 8:00 am through 5:00 pm and calls directly to the nurse may not be answered immediately due to seeing patients, but your call will be returned as soon as possible.  ?Phone  messages received after 4:00 PM Monday through Thursday may not be returned until the following business day. Phone messages received after 11:00 AM on Friday may not be returned until Monday.  ? ?After Hours ?We share on call hours with providers from other offices. If you have an urgent need after hours that cannot wait until the next business day, please contact the on call provider by calling the office number. A nurse will speak with you and contact the provider   if needed for recommendations.  ?If you have an urgent or life threatening emergency after hours, please do not call the on call provider, but seek emergency evaluation by calling 911 or going to the nearest emergency room for evaluation.  ? ?Paperwork ?All paperwork requires a minimum of 5 days to complete and return to you or the designated personnel. Please keep this in mind when bringing in forms or sending requests for paperwork completion to the office.  ?  ?

## 2022-05-30 LAB — CBC WITH DIFFERENTIAL/PLATELET
Basophils Absolute: 0 10*3/uL (ref 0.0–0.2)
Basos: 1 %
EOS (ABSOLUTE): 0 10*3/uL (ref 0.0–0.4)
Eos: 1 %
Hematocrit: 41.1 % (ref 34.0–46.6)
Hemoglobin: 13.7 g/dL (ref 11.1–15.9)
Immature Grans (Abs): 0 10*3/uL (ref 0.0–0.1)
Immature Granulocytes: 0 %
Lymphocytes Absolute: 2.4 10*3/uL (ref 0.7–3.1)
Lymphs: 43 %
MCH: 32.3 pg (ref 26.6–33.0)
MCHC: 33.3 g/dL (ref 31.5–35.7)
MCV: 97 fL (ref 79–97)
Monocytes Absolute: 0.4 10*3/uL (ref 0.1–0.9)
Monocytes: 6 %
Neutrophils Absolute: 2.8 10*3/uL (ref 1.4–7.0)
Neutrophils: 49 %
Platelets: 233 10*3/uL (ref 150–450)
RBC: 4.24 x10E6/uL (ref 3.77–5.28)
RDW: 11.1 % — ABNORMAL LOW (ref 11.7–15.4)
WBC: 5.6 10*3/uL (ref 3.4–10.8)

## 2022-05-30 LAB — COMPREHENSIVE METABOLIC PANEL
ALT: 9 IU/L (ref 0–32)
AST: 16 IU/L (ref 0–40)
Albumin/Globulin Ratio: 1.8 (ref 1.2–2.2)
Albumin: 4.4 g/dL (ref 3.9–5.0)
Alkaline Phosphatase: 67 IU/L (ref 44–121)
BUN/Creatinine Ratio: 11 (ref 9–23)
BUN: 8 mg/dL (ref 6–20)
Bilirubin Total: 0.4 mg/dL (ref 0.0–1.2)
CO2: 20 mmol/L (ref 20–29)
Calcium: 9.4 mg/dL (ref 8.7–10.2)
Chloride: 107 mmol/L — ABNORMAL HIGH (ref 96–106)
Creatinine, Ser: 0.72 mg/dL (ref 0.57–1.00)
Globulin, Total: 2.5 g/dL (ref 1.5–4.5)
Glucose: 86 mg/dL (ref 70–99)
Potassium: 3.8 mmol/L (ref 3.5–5.2)
Sodium: 141 mmol/L (ref 134–144)
Total Protein: 6.9 g/dL (ref 6.0–8.5)
eGFR: 118 mL/min/{1.73_m2} (ref >59)

## 2022-05-30 LAB — IRON,TIBC AND FERRITIN PANEL
Ferritin: 30 ng/mL (ref 15–150)
Iron Saturation: 27 % (ref 15–55)
Iron: 84 ug/dL (ref 27–159)
Total Iron Binding Capacity: 309 ug/dL (ref 250–450)
UIBC: 225 ug/dL (ref 131–425)

## 2022-05-30 LAB — INSULIN, FREE AND TOTAL
Free Insulin: 2.5 uU/mL
Total Insulin: 2.5 uU/mL

## 2022-06-01 ENCOUNTER — Telehealth (HOSPITAL_BASED_OUTPATIENT_CLINIC_OR_DEPARTMENT_OTHER): Payer: Self-pay

## 2022-06-01 NOTE — Telephone Encounter (Signed)
Per Danne Baxter at [facility] # is [phone] for CT abd/pelvis # [phone]

## 2022-06-02 ENCOUNTER — Ambulatory Visit
Admission: RE | Admit: 2022-06-02 | Discharge: 2022-06-02 | Disposition: A | Discharge status: Home / Self Care | Payer: Medicaid Other | Source: Ambulatory Visit | Attending: Nurse Practitioner | Admitting: Nurse Practitioner

## 2022-06-02 DIAGNOSIS — R634 Abnormal weight loss: Secondary | ICD-10-CM | POA: Diagnosis not present

## 2022-06-02 DIAGNOSIS — R1032 Left lower quadrant pain: Secondary | ICD-10-CM | POA: Diagnosis not present

## 2022-06-02 DIAGNOSIS — R1033 Periumbilical pain: Secondary | ICD-10-CM

## 2022-06-02 MED ORDER — IOPAMIDOL (ISOVUE-300) INJECTION 61%
100.0000 mL | Freq: Once | INTRAVENOUS | Status: AC | PRN
  Administered 2022-06-02: 100 mL via INTRAVENOUS

## 2022-06-09 ENCOUNTER — Encounter (HOSPITAL_BASED_OUTPATIENT_CLINIC_OR_DEPARTMENT_OTHER): Payer: Self-pay | Admitting: Nurse Practitioner

## 2022-06-09 ENCOUNTER — Ambulatory Visit (HOSPITAL_BASED_OUTPATIENT_CLINIC_OR_DEPARTMENT_OTHER): Payer: Medicaid Other | Admitting: Nurse Practitioner

## 2022-06-09 DIAGNOSIS — F339 Major depressive disorder, recurrent, unspecified: Secondary | ICD-10-CM | POA: Insufficient documentation

## 2022-06-09 DIAGNOSIS — Z3046 Encounter for surveillance of implantable subdermal contraceptive: Secondary | ICD-10-CM | POA: Insufficient documentation

## 2022-06-09 DIAGNOSIS — K639 Disease of intestine, unspecified: Secondary | ICD-10-CM | POA: Insufficient documentation

## 2022-06-09 DIAGNOSIS — R1033 Periumbilical pain: Secondary | ICD-10-CM | POA: Insufficient documentation

## 2022-06-09 DIAGNOSIS — R634 Abnormal weight loss: Secondary | ICD-10-CM | POA: Insufficient documentation

## 2022-06-09 NOTE — Assessment & Plan Note (Addendum)
Chronic weight loss for the last several years in the setting of severe abdominal pain and lack of appetite. At this time it is unclear what is causing symptoms. She has been started on PPI medication by a different provider, but is not currently taking this. Plan for labs and imaging. GI referral for further evaluation. Will make changes to plan of care as necessary based on lab/imaging findings.

## 2022-06-09 NOTE — Progress Notes (Deleted)
  Worthy Keeler, DNP, AGNP-c [address] [place], [place] [address] [phone] Office [phone] Fax  ESTABLISHED PATIENT- Chronic Health and/or Follow-Up Visit  unknown if currently breastfeeding.  No chief complaint on file.   HPI  Roberta Alexander  is a 26 y.o. year old female presenting today for evaluation and management of the following: Nexplanon Removal Patient is experiencing continuous bleeding since this was placed earlier this year and she would like to have it removed and another form of birth control started.  ROS All ROS negative with exception of what is listed in HPI  PHYSICAL EXAM Physical Exam  ASSESSMENT & PLAN Problem List Items Addressed This Visit   None    FOLLOW-UP No follow-ups on file.   Worthy Keeler, DNP, AGNP-c

## 2022-06-09 NOTE — Assessment & Plan Note (Addendum)
Irregular, prolonged menstrual bleeding since nexplanon placement in February. Suspect decreased body fat and implant triggering the irregular bleeding. Plan to work to find cause of GI disturbance and increase PO intake. Patient would like to have Nexplanon removed, which I feel is reasonable given the symptoms she is experiencing. Will plan for removal in the future as patients schedule allows.

## 2022-06-09 NOTE — Assessment & Plan Note (Addendum)
Depression symptoms remain present, unclear if this could be contributing to her lack of appetite and weight loss. Recommend restart of sertraline to help with mood. She is agreeable to this today. Will send script to pharmacy.

## 2022-06-09 NOTE — Assessment & Plan Note (Signed)
Observation on CT. Significance unclear. Referral to GI placed.

## 2022-06-09 NOTE — Assessment & Plan Note (Signed)
Chronic. Etiology unclear at this time. Mild tenderness on palpation with no abnormalities palpable. Patient has had extensive lab work-up for this concern in the past. Unclear if her underlying anxiety/depression could be exacerbating this or possible IBS symptom. Given the length of time symptoms have been present and the concern over weight loss and associated symptoms, I do recommend an abdominal CT for further evaluation. Will also send GI referral. Labs today for further evaluation.

## 2022-06-13 ENCOUNTER — Encounter: Payer: Self-pay | Admitting: Gastroenterology

## 2022-07-02 ENCOUNTER — Ambulatory Visit (HOSPITAL_COMMUNITY)
Admission: EM | Admit: 2022-07-02 | Discharge: 2022-07-02 | Disposition: A | Discharge status: Home / Self Care | Payer: Medicaid Other | Attending: Emergency Medicine | Admitting: Emergency Medicine

## 2022-07-02 ENCOUNTER — Encounter (HOSPITAL_COMMUNITY): Payer: Self-pay | Admitting: Emergency Medicine

## 2022-07-02 DIAGNOSIS — R1032 Left lower quadrant pain: Secondary | ICD-10-CM | POA: Diagnosis not present

## 2022-07-02 MED ORDER — METRONIDAZOLE 500 MG PO TABS: 500.0000 mg | ORAL_TABLET | Freq: Two times a day (BID) | ORAL | 0 refills | Status: AC

## 2022-07-02 MED ORDER — ONDANSETRON 4 MG PO TBDP: 4.0000 mg | ORAL_TABLET | Freq: Three times a day (TID) | ORAL | 0 refills | Status: AC | PRN

## 2022-07-02 MED ORDER — DICYCLOMINE HCL 20 MG PO TABS: 20.0000 mg | ORAL_TABLET | Freq: Two times a day (BID) | ORAL | 0 refills | Status: AC

## 2022-07-02 NOTE — ED Triage Notes (Signed)
Left lower abdominal pain for a couple of years, recent CT scan with appointment to see GI Aug 4th.  States past few days vomiting and pain are worse and couldn't work today. Laying down improves pain.

## 2022-07-02 NOTE — Discharge Instructions (Addendum)
The cause of your symptoms are unknown at this time but we will move forward with treatment in an attempt to minimize symptoms  Your CT scan on 622 showed prostatitis which means and inflammation to your rectal lining however your symptoms today are not consistent with this condition  Take metronidazole every morning and every evening for the next 7 days, this will provide bacterial coverage, do not drink any alcohol while using this medicine as it will make you feel very sick and cause you to vomit  Take dicyclomine every morning and every evening for the next 10 days, this helps to reduce stomach cramping which in turn ideally will help with your pain  You may use Zofran tablet every 8 hours, lateral to dissolve underneath the tongue then wait at least 30 minutes before attempting to eat or drink  Please eat a blander diet until symptoms have resolved to prevent stomach irritation, avoid spicy and greasy foods  Please increase your fluid intake to maintain your hydration until your vomiting has subsided  At any point if your abdominal pain worsens in severity please go to the nearest emergency department for immediate evaluation  Please keep your upcoming GI appointment for further evaluation and management

## 2022-07-02 NOTE — ED Provider Notes (Signed)
[facility]    CSN: [id number] Arrival date & time: 07/02/22  1213      History   Chief Complaint Chief Complaint  Patient presents with   Abdominal Pain   Emesis    HPI Roberta Alexander is a 26 y.o. female.   Patient presents with left lower quadrant abdominal pain, vomiting, abdominal bloating and increased gas production for 4 days.  Symptoms started abruptly.  Pain does not radiate and is described as severe, constant, rating a 7 out of 10.  Endorses that at times she is unable to even get out of the bed due to the extent of her discomfort .last episode of vomiting was last night, this is is described as food.  Endorses that she is been unable to tolerate eating and typically will vomit within 1 hour and does not feel like the food has digested at all and is sitting in her stomach.  Last bowel movement this morning, described as soft and an increase in the quantity.  Denies blood or mucus present in the stool.  Endorses that she does have some relief of pain after bowel movements and after passing gas.  Endorses that the left lower quadrant pain has been occurring intermittently for 2 years and she has upcoming GI appointment on August 4.  Endorses that her primary doctor completed a CT scan on 622, unsure of results.  Denies fever or chills.    Past Medical History:  Diagnosis Date   Anemia    Anemia during pregnancy in second trimester 01/09/2020   Encounter to establish care 05/07/2021   IUGR (intrauterine growth restriction) affecting care of mother 04/22/2020   Placenta previa antepartum 12/12/2019   Supervision of other normal pregnancy, antepartum 11/06/2019   .Marland Kitchen Nursing Staff Provider Office Location  [facility] Dating  LMP Language  English Anatomy US   Flu Vaccine  Declined  Genetic Screen  NIPS:   AFP:   First Screen:  Quad:   TDaP vaccine   gave info 04-16-20 Hgb A1C or  GTT Early  Third trimester: 1 hr WNL Rhogam     LAB RESULTS  Feeding Plan Breast/Bottle Blood  Type B/Positive/-- (12/01 1021)  Contraception Nexplanon Antibody Negative (12/01 1021) Circ    Patient Active Problem List   Diagnosis Date Noted   Weight loss, unintentional 06/09/2022   Depression, recurrent ([place]) 23/76/2831   Periumbilical abdominal pain 06/09/2022   Bowel wall thickening 06/09/2022   Encounter for Nexplanon removal 06/09/2022   Screening for endocrine, nutritional, metabolic and immunity disorder 05/07/2021   Iron deficiency anemia due to chronic blood loss 05/07/2021   Depression, major, single episode, severe ([place]) 05/07/2021   GAD (generalized anxiety disorder) 05/07/2021   Irritable bowel syndrome with constipation 05/07/2021   Irregular menstrual bleeding 05/07/2021   Mildly underweight adult 11/12/2019    Past Surgical History:  Procedure Laterality Date   INDUCED ABORTION      OB History     Gravida  3   Para  2   Term  2   Preterm  0   AB  1   Living  2      SAB  0   IAB  0   Ectopic  0   Multiple      Live Births  2            Home Medications    Prior to Admission medications   Medication Sig Start Date End Date Taking? Authorizing Provider  ondansetron (ZOFRAN-ODT) 4 MG disintegrating tablet Take 1 tablet (4 mg total) by mouth every 8 (eight) hours as needed for nausea or vomiting. 05/01/22   Barrett Henle, MD  sertraline (ZOLOFT) 50 MG tablet Take 1 tablet (50 mg total) by mouth at bedtime. Take 1/2 tab by mouth for 4 days then increase to 1 tab. 05/24/22   Early, Coralee Pesa, NP    Family History Family History  Problem Relation Age of Onset   Healthy Mother    Healthy Father     Social History Social History   Tobacco Use   Smoking status: Every Day    Packs/day: 3.00    Years: 15.00    Total pack years: 45.00    Types: Cigarettes   Smokeless tobacco: Never   Tobacco comments:    5 cigarettes per day  Vaping Use   Vaping Use: Never used  Substance Use Topics   Alcohol use: Yes   Drug use: Not  Currently     Allergies   Patient has no known allergies.   Review of Systems Review of Systems  Constitutional: Negative.   Respiratory: Negative.    Cardiovascular: Negative.   Gastrointestinal:  Positive for abdominal distention, abdominal pain and vomiting. Negative for anal bleeding, blood in stool, constipation, diarrhea, nausea and rectal pain.  Skin: Negative.   Neurological: Negative.      Physical Exam Triage Vital Signs ED Triage Vitals  Enc Vitals Group     BP 07/02/22 1226 133/89     Pulse --      Resp 07/02/22 1226 15     Temp 07/02/22 1226 98.8 F (37.1 C)     Temp Source 07/02/22 1226 Oral     SpO2 07/02/22 1226 98 %     Weight --      Height --      Head Circumference --      Peak Flow --      Pain Score 07/02/22 1227 7     Pain Loc --      Pain Edu? --      Excl. in [place]? --    No data found.  Updated Vital Signs BP 133/89 (BP Location: Right Arm)   Temp 98.8 F (37.1 C) (Oral)   Resp 15   LMP 06/14/2022   SpO2 98%   Visual Acuity Right Eye Distance:   Left Eye Distance:   Bilateral Distance:    Right Eye Near:   Left Eye Near:    Bilateral Near:     Physical Exam Constitutional:      Appearance: Normal appearance. She is well-developed.  HENT:     Head: Normocephalic.  Eyes:     Extraocular Movements: Extraocular movements intact.  Pulmonary:     Effort: Pulmonary effort is normal.  Abdominal:     General: Abdomen is flat. Bowel sounds are increased.     Palpations: Abdomen is soft.     Tenderness: There is abdominal tenderness in the suprapubic area and left lower quadrant.  Skin:    General: Skin is warm and dry.  Neurological:     General: No focal deficit present.     Mental Status: She is alert and oriented to person, place, and time.  Psychiatric:        Mood and Affect: Mood normal.        Behavior: Behavior normal.      UC Treatments / Results  Labs (all labs ordered are listed, but  only abnormal results are  displayed) Labs Reviewed - No data to display  EKG   Radiology No results found.  Procedures Procedures (including critical care time)  Medications Ordered in UC Medications - No data to display  Initial Impression / Assessment and Plan / UC Course  I have reviewed the triage vital signs and the nursing notes.  Pertinent labs & imaging results that were available during my care of the patient were reviewed by me and considered in my medical decision making (see chart for details).  Left lower quadrant abdominal pain  Vital signs are stable and well visibly uncomfortable patient is in no signs of distress, tenderness is noted to the left lower quadrant and periumbilical region of the abdomen with increased bowel sounds, as patient already has upcoming GI appointment we will attempt to manage outpatient and will avoid emergency department with deferral at this time, reviewed CT scan on 06/02/2022, showing proctitis however symptomology is not consistent with this, discussed with patient, prescribed dicyclomine, Flagyl and Zofran for outpatient management, given strict precautions for worsening symptoms that she is to go to the nearest emergency department for immediate evaluation, strongly advised keeping upcoming appointment with GI Final Clinical Impressions(s) / UC Diagnoses   Final diagnoses:  None   Discharge Instructions   None    ED Prescriptions   None    PDMP not reviewed this encounter.   Hans [place], [place] 07/02/22 1337

## 2022-07-05 ENCOUNTER — Encounter (HOSPITAL_BASED_OUTPATIENT_CLINIC_OR_DEPARTMENT_OTHER): Payer: Self-pay | Admitting: Nurse Practitioner

## 2022-07-15 ENCOUNTER — Ambulatory Visit (INDEPENDENT_AMBULATORY_CARE_PROVIDER_SITE_OTHER): Payer: Medicaid Other | Admitting: Gastroenterology

## 2022-07-15 ENCOUNTER — Encounter: Payer: Self-pay | Admitting: Gastroenterology

## 2022-07-15 VITALS — BP 108/62 | HR 87 | Ht 64.5 in | Wt 97.0 lb

## 2022-07-15 DIAGNOSIS — R933 Abnormal findings on diagnostic imaging of other parts of digestive tract: Secondary | ICD-10-CM | POA: Diagnosis not present

## 2022-07-15 DIAGNOSIS — R112 Nausea with vomiting, unspecified: Secondary | ICD-10-CM

## 2022-07-15 DIAGNOSIS — R1032 Left lower quadrant pain: Secondary | ICD-10-CM | POA: Diagnosis not present

## 2022-07-15 DIAGNOSIS — R6881 Early satiety: Secondary | ICD-10-CM | POA: Diagnosis not present

## 2022-07-15 DIAGNOSIS — K5900 Constipation, unspecified: Secondary | ICD-10-CM | POA: Diagnosis not present

## 2022-07-15 DIAGNOSIS — R634 Abnormal weight loss: Secondary | ICD-10-CM | POA: Diagnosis not present

## 2022-07-15 MED ORDER — CLENPIQ 10-3.5-12 MG-GM -GM/160ML PO SOLN: 320.0000 mL | Freq: Once | ORAL | 0 refills | Status: AC

## 2022-07-15 NOTE — Progress Notes (Signed)
Chief Complaint: Abdominal pain, abnormal imaging, change in bowel habits, constipation, nausea/vomiting, early satiety  Referring Provider:     Orma Render, NP    HPI:     Roberta Alexander is a 26 y.o. female with a history of depression referred to the [facility] for evaluation of LLQ pain, weight loss, decreased appetite, nausea/vomiting, bloating, constipation, and recent abnormal CT.  Has been having multiple GI symptoms since 2015.  Symptoms started after delivery of her first child. Started with decreased appetite and early satiety.  Since then, her weight is overall down, and continues to fluctuate. Weighed 89# in 2021 prior to 2nd pregnancy. Peak was 125# while pregnant in 2021. 97# today. Had been prescribed Megace in 2019, but she does not recall med or efficacy.   Reports early satiety since 2015. +n/v which occurs intermittently. Also with consitpation, bloating, and LLQ/left sided abdominal pain, all also present since 2015. Night sweats since 2021. No hematochezia, melena.  For constipation, has trialed OTC laxatives which initially worked, but eventually lost efficacy.  Trialed increased fiber intake, but early satiety/low appetite limits fiber consumption overall. Has 3 BM/week. Hard stools with +straining to have BM.   Was seen by her PCM for this issue on 05/24/2022.  Exam with TTP in epigastrium, periumbilical area, and LLQ. - 05/24/2022: Normal CBC, iron panel, CMP - 06/02/2022: CT A/P: Questionable rectal wall thickening without obstruction, pneumatosis.  Otherwise normal GI tract, liver, pancreas, spleen, GB  Was then seen in Urgent Care on 07/02/2022 with LLQ pain, nausea/vomiting, abdominal bloating, increased gas x4 days.  Exam with TTP in suprapubic/LLQ.  Was treated with dicyclomine, Flagyl, Zofran.  Reports not much change with Bentyl.   No previous EGD or colonoscopy.  No known family history of CRC, GI malignancy, liver disease,  pancreatic disease, or IBD.   Smokes 5 cigarettes/day.  Rare alcohol.  No marijuana.   Past Medical History:  Diagnosis Date   Anemia    Anemia during pregnancy in second trimester 01/09/2020   Encounter to establish care 05/07/2021   IUGR (intrauterine growth restriction) affecting care of mother 04/22/2020   Placenta previa antepartum 12/12/2019   Supervision of other normal pregnancy, antepartum 11/06/2019   .Marland Kitchen Nursing Staff Provider Office Location  [facility] Dating  LMP Language  English Anatomy US   Flu Vaccine  Declined  Genetic Screen  NIPS:   AFP:   First Screen:  Quad:   TDaP vaccine   gave info 04-16-20 Hgb A1C or  GTT Early  Third trimester: 1 hr WNL Rhogam     LAB RESULTS  Feeding Plan Breast/Bottle Blood Type B/Positive/-- (12/01 1021)  Contraception Nexplanon Antibody Negative (12/01 1021) Circ     Past Surgical History:  Procedure Laterality Date   INDUCED ABORTION     Family History  Problem Relation Age of Onset   Arthritis Mother    Kidney disease Father    HIV/AIDS Maternal Grandmother    Lung cancer Paternal Grandmother    Social History   Tobacco Use   Smoking status: Every Day    Packs/day: 3.00    Years: 15.00    Total pack years: 45.00    Types: Cigarettes   Smokeless tobacco: Never   Tobacco comments:    5 cigarettes per day  Vaping Use   Vaping Use: Never used  Substance Use Topics   Alcohol use: Yes    Comment:  socially   Drug use: Not Currently   Current Outpatient Medications  Medication Sig Dispense Refill   sertraline (ZOLOFT) 50 MG tablet Take 1 tablet (50 mg total) by mouth at bedtime. Take 1/2 tab by mouth for 4 days then increase to 1 tab. 30 tablet 3   dicyclomine (BENTYL) 20 MG tablet Take 1 tablet (20 mg total) by mouth 2 (two) times daily. (Patient not taking: Reported on 07/15/2022) 20 tablet 0   metroNIDAZOLE (FLAGYL) 500 MG tablet Take 1 tablet (500 mg total) by mouth 2 (two) times daily. (Patient not taking: Reported on 07/15/2022) 14  tablet 0   ondansetron (ZOFRAN-ODT) 4 MG disintegrating tablet Take 1 tablet (4 mg total) by mouth every 8 (eight) hours as needed for nausea or vomiting. (Patient not taking: Reported on 07/15/2022) 20 tablet 0   No current facility-administered medications for this visit.   No Known Allergies   Review of Systems: All systems reviewed and negative except where noted in HPI.     Physical Exam:    Wt Readings from Last 3 Encounters:  07/15/22 97 lb (44 kg)  05/24/22 93 lb (42.2 kg)  05/01/22 98 lb (44.5 kg)    Ht 5' 4.5" (1.638 m) Comment: height measured without shoes  Wt 97 lb (44 kg)   LMP 06/17/2022   Breastfeeding No   BMI 16.39 kg/m   Constitutional:  Pleasant, in no acute distress. Psychiatric: Normal mood and affect. Behavior is normal. Cardiovascular: Normal rate, regular rhythm. No edema Pulmonary/chest: Effort normal and breath sounds normal. No wheezing, rales or rhonchi. Abdominal: Soft, nondistended, nontender. Bowel sounds active throughout. There are no masses palpable. No hepatomegaly. Neurological: Alert and oriented to person place and time. Skin: Skin is warm and dry. No rashes noted.   ASSESSMENT AND PLAN;   1) Unintentional weight loss 2) Early satiety/Decreased appetite 3) Nausea/Vomiting 4) Constipation 5) Abdominal bloating 6) Abnormal imaging study (CT) 7) LLQ pain  - EGD and colonoscopy to evaluate for mucosal/luminal pathology - Plan to use ultraslim scope given very thin body habitus - Extended 2-day bowel preparation - Thyroid panel had been previously ordered by PCM, but not yet resulted.  Can repeat if still not released at time of follow-up -Continue p.o. intake as tolerated.  Likely benefit from planned 6 small meals/day with liquid replacement meal (i.e. Ensure, boost, Premier protein, etc.) for at least 2 of those meals daily  The indications, risks, and benefits of EGD and colonoscopy were explained to the patient in detail. Risks  include but are not limited to bleeding, perforation, adverse reaction to medications, and cardiopulmonary compromise. Sequelae include but are not limited to the possibility of surgery, hospitalization, and mortality. The patient verbalized understanding and wished to proceed. All questions answered, referred to scheduler and bowel prep ordered. Further recommendations pending results of the exam.    Lavena Bullion, DO, FACG  07/15/2022, 9:31 AM   Early, Coralee Pesa, NP

## 2022-07-15 NOTE — Patient Instructions (Signed)
_______________________________________________________  If you are age 27 or older, your body mass index should be between 23-30. Your Body mass index is 16.39 kg/m. If this is out of the aforementioned range listed, please consider follow up with your Primary Care Provider.  If you are age 11 or younger, your body mass index should be between 19-25. Your Body mass index is 16.39 kg/m. If this is out of the aformentioned range listed, please consider follow up with your Primary Care Provider.   You have been scheduled for an endoscopy and colonoscopy. Please follow the written instructions given to you at your visit today. Please pick up your prep supplies at the pharmacy within the next 1-3 days. If you use inhalers (even only as needed), please bring them with you on the day of your procedure.   The [facility] providers would like to encourage you to use [facility] to communicate with providers for non-urgent requests or questions.  Due to long hold times on the telephone, sending your provider a message by [facility] may be a faster and more efficient way to get a response.  Please allow 48 business hours for a response.  Please remember that this is for non-urgent requests.   It was a pleasure to see you today!  Thank you for trusting me with your gastrointestinal care!

## 2022-07-25 ENCOUNTER — Ambulatory Visit (HOSPITAL_BASED_OUTPATIENT_CLINIC_OR_DEPARTMENT_OTHER): Payer: Medicaid Other | Admitting: Nurse Practitioner

## 2022-07-25 ENCOUNTER — Encounter (HOSPITAL_BASED_OUTPATIENT_CLINIC_OR_DEPARTMENT_OTHER): Payer: Self-pay | Admitting: Nurse Practitioner

## 2022-07-25 VITALS — BP 106/79 | HR 111 | Ht 64.0 in | Wt 99.8 lb

## 2022-07-25 DIAGNOSIS — Z3046 Encounter for surveillance of implantable subdermal contraceptive: Secondary | ICD-10-CM

## 2022-08-02 ENCOUNTER — Encounter: Payer: Self-pay | Admitting: Gastroenterology

## 2022-08-02 ENCOUNTER — Ambulatory Visit (AMBULATORY_SURGERY_CENTER): Payer: Medicaid Other | Admitting: Gastroenterology

## 2022-08-02 VITALS — BP 142/96 | HR 60 | Temp 98.7°F | Resp 15 | Ht 64.0 in | Wt 97.0 lb

## 2022-08-02 DIAGNOSIS — D125 Benign neoplasm of sigmoid colon: Secondary | ICD-10-CM

## 2022-08-02 DIAGNOSIS — K635 Polyp of colon: Secondary | ICD-10-CM | POA: Diagnosis not present

## 2022-08-02 DIAGNOSIS — R933 Abnormal findings on diagnostic imaging of other parts of digestive tract: Secondary | ICD-10-CM

## 2022-08-02 DIAGNOSIS — R6881 Early satiety: Secondary | ICD-10-CM | POA: Diagnosis not present

## 2022-08-02 DIAGNOSIS — K5900 Constipation, unspecified: Secondary | ICD-10-CM | POA: Diagnosis not present

## 2022-08-02 DIAGNOSIS — R634 Abnormal weight loss: Secondary | ICD-10-CM

## 2022-08-02 DIAGNOSIS — K2950 Unspecified chronic gastritis without bleeding: Secondary | ICD-10-CM | POA: Diagnosis not present

## 2022-08-02 DIAGNOSIS — R112 Nausea with vomiting, unspecified: Secondary | ICD-10-CM

## 2022-08-02 DIAGNOSIS — R1032 Left lower quadrant pain: Secondary | ICD-10-CM | POA: Diagnosis not present

## 2022-08-02 MED ORDER — SODIUM CHLORIDE 0.9 % IV SOLN: 500.0000 mL | Freq: Once | INTRAVENOUS | Status: DC

## 2022-08-02 NOTE — Progress Notes (Unsigned)
GASTROENTEROLOGY PROCEDURE H&P NOTE   Primary Care Physician: Orma Render, NP    Reason for Procedure:  Abdominal pain, bloating, change in bowel habits, constipation, nausea/vomiting, early satiety, decreased appetite, abnormal CT  Plan:    EGD/colonoscopy  Patient is appropriate for endoscopic procedure(s) in the ambulatory ([place]) setting.  The nature of the procedure, as well as the risks, benefits, and alternatives were carefully and thoroughly reviewed with the patient. Ample time for discussion and questions allowed. The patient understood, was satisfied, and agreed to proceed.     HPI: Roberta Alexander is a 26 y.o. female who presents for EGD and colonoscopy for evaluation of multiple GI symptoms to include abdominal pain, change in bowel habits, constipation, nausea/vomiting, early satiety, along with evaluation of rectal wall thickening on recent CT.  Patient was most recently seen in the Gastroenterology Clinic on 07/15/2022 by me.  No interval change in medical history since that appointment. Please refer to that note for full details regarding GI history and clinical presentation.   Past Medical History:  Diagnosis Date   Anemia    Anemia during pregnancy in second trimester 01/09/2020   Encounter to establish care 05/07/2021   IUGR (intrauterine growth restriction) affecting care of mother 04/22/2020   Placenta previa antepartum 12/12/2019   Supervision of other normal pregnancy, antepartum 11/06/2019   .[facility] Nursing Staff Provider Office Location  [facility] Dating  LMP Language  English Anatomy US   Flu Vaccine  Declined  Genetic Screen  NIPS:   AFP:   First Screen:  Quad:   TDaP vaccine   gave info 04-16-20 Hgb A1C or  GTT Early  Third trimester: 1 hr WNL Rhogam     LAB RESULTS  Feeding Plan Breast/Bottle Blood Type B/Positive/-- (12/01 1021)  Contraception Nexplanon Antibody Negative (12/01 1021) Circ    Past Surgical History:  Procedure Laterality Date   INDUCED ABORTION       Prior to Admission medications   Medication Sig Start Date End Date Taking? Authorizing Provider  dicyclomine (BENTYL) 20 MG tablet Take 1 tablet (20 mg total) by mouth 2 (two) times daily. Patient not taking: Reported on 07/15/2022 07/02/22   Hans Eden, NP  metroNIDAZOLE (FLAGYL) 500 MG tablet Take 1 tablet (500 mg total) by mouth 2 (two) times daily. Patient not taking: Reported on 07/15/2022 07/02/22   Hans Eden, NP  ondansetron (ZOFRAN-ODT) 4 MG disintegrating tablet Take 1 tablet (4 mg total) by mouth every 8 (eight) hours as needed for nausea or vomiting. 07/02/22   Hans Eden, NP  sertraline (ZOLOFT) 50 MG tablet Take 1 tablet (50 mg total) by mouth at bedtime. Take 1/2 tab by mouth for 4 days then increase to 1 tab. 05/24/22   Early, Coralee Pesa, NP    Current Outpatient Medications  Medication Sig Dispense Refill   dicyclomine (BENTYL) 20 MG tablet Take 1 tablet (20 mg total) by mouth 2 (two) times daily. (Patient not taking: Reported on 07/15/2022) 20 tablet 0   metroNIDAZOLE (FLAGYL) 500 MG tablet Take 1 tablet (500 mg total) by mouth 2 (two) times daily. (Patient not taking: Reported on 07/15/2022) 14 tablet 0   ondansetron (ZOFRAN-ODT) 4 MG disintegrating tablet Take 1 tablet (4 mg total) by mouth every 8 (eight) hours as needed for nausea or vomiting. 20 tablet 0   sertraline (ZOLOFT) 50 MG tablet Take 1 tablet (50 mg total) by mouth at bedtime. Take 1/2 tab by mouth for 4 days  then increase to 1 tab. 30 tablet 3   Current Facility-Administered Medications  Medication Dose Route Frequency Provider Last Rate Last Admin   0.9 %  sodium chloride infusion  500 mL Intravenous Once Katleen Carraway V, DO        Allergies as of 08/02/2022   (No Known Allergies)    Family History  Problem Relation Age of Onset   Arthritis Mother    Kidney disease Father    HIV/AIDS Maternal Grandmother    Lung cancer Paternal Grandmother     Social History   Socioeconomic History    Marital status: Married    Spouse name: Harrell Gave   Number of children: 2   Years of education: Not on file   Highest education level: Not on file  Occupational History   Occupation: customer service  Tobacco Use   Smoking status: Every Day    Packs/day: 3.00    Years: 15.00    Total pack years: 45.00    Types: Cigarettes   Smokeless tobacco: Never   Tobacco comments:    5 cigarettes per day  Vaping Use   Vaping Use: Never used  Substance and Sexual Activity   Alcohol use: Yes    Comment: socially   Drug use: Not Currently   Sexual activity: Yes    Birth control/protection: Implant  Other Topics Concern   Not on file  Social History Narrative   ** Merged History Encounter **       ** Merged History Encounter **       Social Determinants of Health   Financial Resource Strain: Not on file  Food Insecurity: Not on file  Transportation Needs: Not on file  Physical Activity: Not on file  Stress: Not on file  Social Connections: Not on file  Intimate Partner Violence: Not on file    Physical Exam: Vital signs in last 24 hours: '@BP'$  (!) 129/93   Pulse 80   Temp 98.7 F (37.1 C)   Resp 12   Ht '5\' 4"'$  (1.626 m)   Wt 97 lb (44 kg)   LMP 06/17/2022   SpO2 100%   BMI 16.65 kg/m  GEN: NAD EYE: Sclerae anicteric ENT: MMM CV: Non-tachycardic Pulm: CTA b/l GI: Soft, NT/ND NEURO:  Alert & Oriented x 3   Gerrit Heck, DO [facility]   08/02/2022 1:33 PM

## 2022-08-02 NOTE — Progress Notes (Unsigned)
Called to room to assist during endoscopic procedure.  Patient ID and intended procedure confirmed with present staff. Received instructions for my participation in the procedure from the performing physician.  

## 2022-08-02 NOTE — Patient Instructions (Signed)
Please read handouts provided. Continue present medications. Await pathology results. Return to GI office as needed.   YOU HAD AN ENDOSCOPIC PROCEDURE TODAY AT [facility]:   Refer to the procedure report that was given to you for any specific questions about what was found during the examination.  If the procedure report does not answer your questions, please call your gastroenterologist to clarify.  If you requested that your care partner not be given the details of your procedure findings, then the procedure report has been included in a sealed envelope for you to review at your convenience later.  YOU SHOULD EXPECT: Some feelings of bloating in the abdomen. Passage of more gas than usual.  Walking can help get rid of the air that was put into your GI tract during the procedure and reduce the bloating. If you had a lower endoscopy (such as a colonoscopy or flexible sigmoidoscopy) you may notice spotting of blood in your stool or on the toilet paper. If you underwent a bowel prep for your procedure, you may not have a normal bowel movement for a few days.  Please Note:  You might notice some irritation and congestion in your nose or some drainage.  This is from the oxygen used during your procedure.  There is no need for concern and it should clear up in a day or so.  SYMPTOMS TO REPORT IMMEDIATELY:  Following lower endoscopy (colonoscopy or flexible sigmoidoscopy):  Excessive amounts of blood in the stool  Significant tenderness or worsening of abdominal pains  Swelling of the abdomen that is new, acute  Fever of 100F or higher  Following upper endoscopy (EGD)  Vomiting of blood or coffee ground material  New chest pain or pain under the shoulder blades  Painful or persistently difficult swallowing  New shortness of breath  Fever of 100F or higher  Black, tarry-looking stools  For urgent or emergent issues, a gastroenterologist can be reached at any hour by calling  [phone]. Do not use MyChart messaging for urgent concerns.    DIET:  We do recommend a small meal at first, but then you may proceed to your regular diet.  Drink plenty of fluids but you should avoid alcoholic beverages for 24 hours.  ACTIVITY:  You should plan to take it easy for the rest of today and you should NOT DRIVE or use heavy machinery until tomorrow (because of the sedation medicines used during the test).    FOLLOW UP: Our staff will call the number listed on your records the next business day following your procedure.  We will call around 7:15- 8:00 am to check on you and address any questions or concerns that you may have regarding the information given to you following your procedure. If we do not reach you, we will leave a message.  If you develop any symptoms (ie: fever, flu-like symptoms, shortness of breath, cough etc.) before then, please call [phone].  If you test positive for Covid 19 in the 2 weeks post procedure, please call and report this information to [place].    If any biopsies were taken you will be contacted by phone or by letter within the next 1-3 weeks.  Please call us at [phone] if you have not heard about the biopsies in 3 weeks.    SIGNATURES/CONFIDENTIALITY: You and/or your care partner have signed paperwork which will be entered into your electronic medical record.  These signatures attest to the fact that that the information  above on your After Visit Summary has been reviewed and is understood.  Full responsibility of the confidentiality of this discharge information lies with you and/or your care-partner.  

## 2022-08-02 NOTE — Op Note (Signed)
[facility] Patient Name: Roberta Alexander Procedure Date: 08/02/2022 12:25 PM MRN: [id number] Endoscopist: Gerrit Heck , MD Age: 26 Referring MD:  Date of Birth: November 20, 1996 Gender: Female Account #: [id number] Procedure:                Colonoscopy Indications:              Abdominal pain in the left lower quadrant, Abnormal                            CT of the GI tract, Change in bowel habits,                            Constipation, Weight loss Medicines:                Monitored Anesthesia Care Procedure:                Pre-Anesthesia Assessment:                           - Prior to the procedure, a History and Physical                            was performed, and patient medications and                            allergies were reviewed. The patient's tolerance of                            previous anesthesia was also reviewed. The risks                            and benefits of the procedure and the sedation                            options and risks were discussed with the patient.                            All questions were answered, and informed consent                            was obtained. Prior Anticoagulants: The patient has                            taken no previous anticoagulant or antiplatelet                            agents. ASA Grade Assessment: II - A patient with                            mild systemic disease. After reviewing the risks                            and benefits, the patient was deemed in  satisfactory condition to undergo the procedure.                           - Prior to the procedure, a History and Physical                            was performed, and patient medications and                            allergies were reviewed. The patient's tolerance of                            previous anesthesia was also reviewed. The risks                            and benefits of the procedure and the  sedation                            options and risks were discussed with the patient.                            All questions were answered, and informed consent                            was obtained. Prior Anticoagulants: The patient has                            taken no previous anticoagulant or antiplatelet                            agents. ASA Grade Assessment: II - A patient with                            mild systemic disease. After reviewing the risks                            and benefits, the patient was deemed in                            satisfactory condition to undergo the procedure.                           After obtaining informed consent, the colonoscope                            was passed under direct vision. Throughout the                            procedure, the patient's blood pressure, pulse, and                            oxygen saturations were monitored continuously. The  [id number]-HQ190L Colonoscope was introduced through the                            anus and advanced to the the terminal ileum. The                            colonoscopy was performed without difficulty. The                            patient tolerated the procedure well. The quality                            of the bowel preparation was good. The terminal                            ileum, ileocecal valve, appendiceal orifice, and                            rectum were photographed. Scope In: 1:50:36 PM Scope Out: 2:02:59 PM Scope Withdrawal Time: 0 hours 10 minutes 28 seconds  Total Procedure Duration: 0 hours 12 minutes 23 seconds  Findings:                 The perianal and digital rectal examinations were                            normal.                           A 2 mm polyp was found in the sigmoid colon. The                            polyp was sessile. The polyp was removed with a                            cold biopsy forceps. Resection and retrieval  were                            complete. Estimated blood loss was minimal.                           The colon otherwise appeared normal throughout. No                            areas of mucosal erythema, edema, erosions, or                            ulceration.                           The retroflexed view of the distal rectum and anal                            verge was normal and showed no anal or rectal  abnormalities.                           The terminal ileum appeared normal. Complications:            No immediate complications. Estimated Blood Loss:     Estimated blood loss was minimal. Impression:               - One 2 mm polyp in the sigmoid colon, removed with                            a cold biopsy forceps. Resected and retrieved.                           - The entire examined colon is normal.                           - The distal rectum and anal verge are normal on                            retroflexion view.                           - The examined portion of the ileum was normal. Recommendation:           - Patient has a contact number available for                            emergencies. The signs and symptoms of potential                            delayed complications were discussed with the                            patient. Return to normal activities tomorrow.                            Written discharge instructions were provided to the                            patient.                           - Resume previous diet.                           - Continue present medications.                           - Await pathology results.                           - Repeat colonoscopy for surveillance based on                            pathology results. If polyp is not adenomatous,  repeat colonoscopy at age 10 for routine Colon                            Cancer screening. If adenomatous, plan for repeat                             colonoscopy in 7 years.                           - Return to GI office PRN. Gerrit Heck, MD 08/02/2022 2:11:22 PM

## 2022-08-02 NOTE — Progress Notes (Unsigned)
PT taken to PACU. Monitors in place. VSS. Report given to RN. 

## 2022-08-02 NOTE — Op Note (Signed)
[facility] Patient Name: Roberta Alexander Procedure Date: 08/02/2022 12:25 PM MRN: [id number] Endoscopist: Gerrit Heck , MD Age: 26 Referring MD:  Date of Birth: [birth year] Gender: Female Account #: [id number] Procedure:                Upper GI endoscopy Indications:              Generalized abdominal pain, Abdominal bloating,                            Early satiety, Nausea with vomiting, Weight loss Medicines:                Monitored Anesthesia Care Procedure:                Pre-Anesthesia Assessment:                           - Prior to the procedure, a History and Physical                            was performed, and patient medications and                            allergies were reviewed. The patient's tolerance of                            previous anesthesia was also reviewed. The risks                            and benefits of the procedure and the sedation                            options and risks were discussed with the patient.                            All questions were answered, and informed consent                            was obtained. Prior Anticoagulants: The patient has                            taken no previous anticoagulant or antiplatelet                            agents. ASA Grade Assessment: II - A patient with                            mild systemic disease. After reviewing the risks                            and benefits, the patient was deemed in                            satisfactory condition to undergo the procedure.  After obtaining informed consent, the endoscope was                            passed under direct vision. Throughout the                            procedure, the patient's blood pressure, pulse, and                            oxygen saturations were monitored continuously. The                            GIF HQ190 #[id number] was introduced through the                            mouth, and  advanced to the second part of duodenum.                            The upper GI endoscopy was accomplished without                            difficulty. The patient tolerated the procedure                            well. Scope In: Scope Out: Findings:                 The examined esophagus was normal.                           The entire examined stomach was normal. Biopsies                            were taken with a cold forceps for histology.                            Estimated blood loss was minimal.                           The examined duodenum was normal. Biopsies were                            taken with a cold forceps for histology. Estimated                            blood loss was minimal. Complications:            No immediate complications. Estimated Blood Loss:     Estimated blood loss was minimal. Impression:               - Normal esophagus.                           - Normal stomach. Biopsied.                           -  Normal examined duodenum. Biopsied. Recommendation:           - Patient has a contact number available for                            emergencies. The signs and symptoms of potential                            delayed complications were discussed with the                            patient. Return to normal activities tomorrow.                            Written discharge instructions were provided to the                            patient.                           - Resume previous diet.                           - Continue present medications.                           - Await pathology results. Gerrit Heck, MD 08/02/2022 2:07:26 PM

## 2022-08-03 ENCOUNTER — Telehealth: Payer: Self-pay | Admitting: *Deleted

## 2022-08-03 NOTE — Telephone Encounter (Signed)
Attempted to call patient for their post-procedure follow-up call. No answer. Unable to leave voicemail due to mailbox full. Patient should call us back if she has any questions or concerns.

## 2022-08-16 ENCOUNTER — Telehealth: Payer: Self-pay

## 2022-08-16 NOTE — Telephone Encounter (Signed)
-----   Message from Lavena Bullion, DO sent at 08/16/2022  8:05 AM EDT ----- Regarding: FW: pathology results Please assist ----- Message ----- From: Samantha Crimes, RN Sent: 08/12/2022   9:51 PM EDT To: Lavena Bullion, DO Subject: pathology results                              This patient hasn't seen result note from procedure.  Please have your nurse reach out with results.  Thanks

## 2022-08-16 NOTE — Telephone Encounter (Signed)
Attempted to call pt but was unable to leave a voicemail. Called pt's husband and left voicemail.

## 2022-08-18 NOTE — Telephone Encounter (Signed)
Unable to leave voicemail on pt's phone. Called pt's husband and left voicemail. Letter mailed to pt with results.

## 2022-08-28 NOTE — Assessment & Plan Note (Signed)
Pre-Op Diagnosis: Nexplanon, desire for contraception change Post-Op Diagnosis: Same Procedure: Nexplanon Removal Performing Provider: Orma Render, DNP, AGNP-c Assistant: Vaughan Basta, CMA Procedure: Anesthesia: 1% lidocaine with epinephrine 74m with sodium bicarbonate 126mConsent obtained.  A time out was performed prior to initiation to ensure right patient, right location, right procedure.  Patient was placed in supine position with left arm flexed in a 90 degree angle for access.  The device was palpated in the arm to ensure location.  The area surrounding the Nexplanon was swabbed with chlorhexidine x 3 and draped in the usual sterile manner.  The site was anesthetized with lidocaine/epinephrine/bicarbonate mixture in ratios listed above.  The device was palpated at the proximal end and the distal end of the device was raised causing slight tenting of the skin.  1cm incision was made over the distal aspect of the device with #11 blade.  The device was grasped with hemostats and the capsule was lysed sharply.  The device was removed with the hemostats without incident. Device was inspected and found to be complete without any signs of damage.  Pressure was applied to removal site with sterile guaze and held in place for 5 minutes to ensure hemostasis.  The site was dressed with dermabond and covered with a pressure dressing.  The patient tolerated the procedure well.   Follow-up: The patient tolerated the procedure without complication. Standard post-procedure care was explained and return precautions provided in verbal and written form. Contraception was discussed with patient and advised until conception is desired.

## 2022-08-28 NOTE — Progress Notes (Signed)
  Orma Render, DNP, AGNP-c [facility] [address]  [place], [place] [address] [phone]  Subjective:   Roberta Alexander is a 26 y.o. female presents to day for evaluation of: Procedure (Patient presents today for removal of implant let arm.)  There are no diagnoses linked to this encounter.  Patient request removal of nexplanon implant today. No symptoms or concerns.   PMH, Medications, and Allergies reviewed and updated in chart as appropriate.   ROS negative except for what is listed in HPI. Objective:  BP 106/79   Pulse (!) 111   Ht '5\' 4"'$  (1.626 m)   Wt 99 lb 12.8 oz (45.3 kg)   LMP 06/17/2022   SpO2 96%   BMI 17.13 kg/m  Physical Exam Vitals and nursing note reviewed.  Constitutional:      Appearance: Normal appearance.  Cardiovascular:     Rate and Rhythm: Normal rate and regular rhythm.     Pulses: Normal pulses.     Heart sounds: Normal heart sounds.  Pulmonary:     Effort: Pulmonary effort is normal.     Breath sounds: Normal breath sounds.  Musculoskeletal:        General: Normal range of motion.  Skin:    General: Skin is warm and dry.  Neurological:     General: No focal deficit present.     Mental Status: She is alert and oriented to person, place, and time.           Assessment & Plan:   Problem List Items Addressed This Visit     Encounter for Nexplanon removal - Primary    Pre-Op Diagnosis: Nexplanon, desire for contraception change Post-Op Diagnosis: Same Procedure: Nexplanon Removal Performing Provider: Orma Render, DNP, AGNP-c Assistant: Vaughan Basta, CMA Procedure: Anesthesia: 1% lidocaine with epinephrine 36m with sodium bicarbonate 132mConsent obtained.  A time out was performed prior to initiation to ensure right patient, right location, right procedure.  Patient was placed in supine position with left arm flexed in a 90 degree angle for access.  The device was palpated in  the arm to ensure location.  The area surrounding the Nexplanon was swabbed with chlorhexidine x 3 and draped in the usual sterile manner.  The site was anesthetized with lidocaine/epinephrine/bicarbonate mixture in ratios listed above.  The device was palpated at the proximal end and the distal end of the device was raised causing slight tenting of the skin.  1cm incision was made over the distal aspect of the device with #11 blade.  The device was grasped with hemostats and the capsule was lysed sharply.  The device was removed with the hemostats without incident. Device was inspected and found to be complete without any signs of damage.  Pressure was applied to removal site with sterile guaze and held in place for 5 minutes to ensure hemostasis.  The site was dressed with dermabond and covered with a pressure dressing.  The patient tolerated the procedure well.   Follow-up: The patient tolerated the procedure without complication. Standard post-procedure care was explained and return precautions provided in verbal and written form. Contraception was discussed with patient and advised until conception is desired.           SaOrma RenderDNP, AGNP-c 08/28/2022  3:18 PM    History, Medications, Surgery, SDOH, and Family History reviewed and updated as appropriate.

## 2022-11-01 ENCOUNTER — Emergency Department (HOSPITAL_BASED_OUTPATIENT_CLINIC_OR_DEPARTMENT_OTHER)
Admission: EM | Admit: 2022-11-01 | Discharge: 2022-11-02 | Disposition: A | Discharge status: Home / Self Care | Payer: Medicaid Other | Attending: Emergency Medicine | Admitting: Emergency Medicine

## 2022-11-01 ENCOUNTER — Encounter (HOSPITAL_BASED_OUTPATIENT_CLINIC_OR_DEPARTMENT_OTHER): Payer: Self-pay | Admitting: Emergency Medicine

## 2022-11-01 ENCOUNTER — Other Ambulatory Visit: Payer: Self-pay

## 2022-11-01 DIAGNOSIS — K5900 Constipation, unspecified: Secondary | ICD-10-CM | POA: Diagnosis not present

## 2022-11-01 NOTE — ED Provider Notes (Signed)
   [facility]  Provider Note  CSN: [id number] Arrival date & time: 11/01/22 2311  History Chief Complaint  Patient presents with   Constipation    Roberta Alexander is a 26 y.o. female with history of bowel problems including intermittent constipation has been evaluated by GI, had normal EGG and colonoscopy earlier this year. She reports no BM in about a week despite miralax and colace at home. She reports she feels a hard stool in her rectum but cannot get it out. No fever, vomiting or dysuria.    Home Medications Prior to Admission medications   Medication Sig Start Date End Date Taking? Authorizing Provider  dicyclomine (BENTYL) 20 MG tablet Take 1 tablet (20 mg total) by mouth 2 (two) times daily. Patient not taking: Reported on 07/15/2022 07/02/22   Hans Eden, NP  metroNIDAZOLE (FLAGYL) 500 MG tablet Take 1 tablet (500 mg total) by mouth 2 (two) times daily. Patient not taking: Reported on 07/15/2022 07/02/22   Hans Eden, NP  ondansetron (ZOFRAN-ODT) 4 MG disintegrating tablet Take 1 tablet (4 mg total) by mouth every 8 (eight) hours as needed for nausea or vomiting. 07/02/22   Hans Eden, NP  sertraline (ZOLOFT) 50 MG tablet Take 1 tablet (50 mg total) by mouth at bedtime. Take 1/2 tab by mouth for 4 days then increase to 1 tab. 05/24/22   Early, Coralee Pesa, NP     Allergies    Patient has no known allergies.   Review of Systems   Review of Systems Please see HPI for pertinent positives and negatives  Physical Exam BP 107/89 (BP Location: Right Arm)   Pulse (!) 103   Temp 98.5 F (36.9 C) (Oral)   Resp 16   LMP 10/10/2022 (Approximate)   SpO2 100%   Physical Exam Vitals and nursing note reviewed.  HENT:     Head: Normocephalic.     Nose: Nose normal.  Eyes:     Extraocular Movements: Extraocular movements intact.  Pulmonary:     Effort: Pulmonary effort is normal.  Genitourinary:    Comments: Chaperone present, moderate  sized soft stool ball in rectum was digitally broken into smaller pieces, would not tolerate disimpaction Musculoskeletal:        General: Normal range of motion.     Cervical back: Neck supple.  Skin:    Findings: No rash (on exposed skin).  Neurological:     Mental Status: She is alert and oriented to person, place, and time.  Psychiatric:        Mood and Affect: Mood normal.     ED Results / Procedures / Treatments   EKG None  Procedures Procedures  Medications Ordered in the ED Medications - No data to display  Initial Impression and Plan  Patient here with constipation/fecal impaction. Stool ball was broken up digitally, will let her try to evacuate her bowels now. If not successful may need enema.   ED Course       MDM Rules/Calculators/A&P Medical Decision Making   Final Clinical Impression(s) / ED Diagnoses Final diagnoses:  None    Rx / DC Orders ED Discharge Orders     None

## 2022-11-01 NOTE — ED Notes (Signed)
In with Dr.Sheldon when he attempted dis-impaction

## 2022-11-01 NOTE — ED Triage Notes (Signed)
Reports no BM since last week. No results with otc meds. Had similar a few months ago,

## 2022-11-02 MED ORDER — FLEET ENEMA 7-19 GM/118ML RE ENEM
1.0000 | ENEMA | Freq: Once | RECTAL | Status: AC
  Administered 2022-11-02: 1 via RECTAL
  Filled 2022-11-02: qty 1

## 2022-11-02 NOTE — ED Notes (Signed)
Patient has returned to restroom for an enema

## 2022-11-02 NOTE — ED Notes (Signed)
Patient had moderate amount of stool after fleets enema. MD notified.

## 2022-11-08 ENCOUNTER — Telehealth: Payer: Self-pay

## 2022-11-08 NOTE — Telephone Encounter (Signed)
Cirigliano, Dominic Pea, DO  Marice Potter, RN ER eval reviewed.  Can you  please call to check in on this patient.  If still dealing with constipation, please schedule follow-up appointment with me or one of the APP's.  May need further evaluation [place] study anorectal manometry.

## 2022-11-08 NOTE — Telephone Encounter (Signed)
Left message for pt to call back  °

## 2022-11-23 NOTE — Telephone Encounter (Signed)
Left message for pt to call back  °

## 2022-11-24 NOTE — Telephone Encounter (Signed)
Unable to reach pt. Mailbox is full. Letter mailed to pt.

## 2024-02-27 DIAGNOSIS — F320 Major depressive disorder, single episode, mild: Secondary | ICD-10-CM | POA: Diagnosis not present

## 2024-03-05 DIAGNOSIS — F320 Major depressive disorder, single episode, mild: Secondary | ICD-10-CM | POA: Diagnosis not present

## 2024-03-10 DIAGNOSIS — F320 Major depressive disorder, single episode, mild: Secondary | ICD-10-CM | POA: Diagnosis not present

## 2024-03-19 DIAGNOSIS — F320 Major depressive disorder, single episode, mild: Secondary | ICD-10-CM | POA: Diagnosis not present

## 2024-04-05 DIAGNOSIS — F320 Major depressive disorder, single episode, mild: Secondary | ICD-10-CM | POA: Diagnosis not present

## 2024-04-17 DIAGNOSIS — F320 Major depressive disorder, single episode, mild: Secondary | ICD-10-CM | POA: Diagnosis not present

## 2024-04-19 DIAGNOSIS — F320 Major depressive disorder, single episode, mild: Secondary | ICD-10-CM | POA: Diagnosis not present

## 2024-04-26 DIAGNOSIS — F320 Major depressive disorder, single episode, mild: Secondary | ICD-10-CM | POA: Diagnosis not present
